# Patient Record
Sex: Female | Born: 2000 | Hispanic: No | Marital: Single | Smoking: Never smoker
Health system: Southern US, Community
[De-identification: ages and names within clinical notes are randomized; demographics above are authoritative.]

## PROBLEM LIST (undated history)

## (undated) DIAGNOSIS — D352 Benign neoplasm of pituitary gland: Secondary | ICD-10-CM

## (undated) DIAGNOSIS — F32A Depression, unspecified: Secondary | ICD-10-CM

## (undated) DIAGNOSIS — F419 Anxiety disorder, unspecified: Secondary | ICD-10-CM

## (undated) DIAGNOSIS — R51 Headache: Secondary | ICD-10-CM

## (undated) DIAGNOSIS — R519 Headache, unspecified: Secondary | ICD-10-CM

## (undated) DIAGNOSIS — R000 Tachycardia, unspecified: Secondary | ICD-10-CM

## (undated) DIAGNOSIS — K760 Fatty (change of) liver, not elsewhere classified: Secondary | ICD-10-CM

## (undated) DIAGNOSIS — J9801 Acute bronchospasm: Secondary | ICD-10-CM

## (undated) DIAGNOSIS — I471 Supraventricular tachycardia, unspecified: Secondary | ICD-10-CM

## (undated) HISTORY — DX: Headache, unspecified: R51.9

## (undated) HISTORY — DX: Headache: R51

## (undated) HISTORY — DX: Benign neoplasm of pituitary gland: D35.2

## (undated) HISTORY — PX: ELBOW SURGERY: SHX618

## (undated) HISTORY — DX: Acute bronchospasm: J98.01

## (undated) HISTORY — DX: Depression, unspecified: F32.A

## (undated) HISTORY — DX: Anxiety disorder, unspecified: F41.9

## (undated) HISTORY — PX: ABLATION: SHX5711

## (undated) HISTORY — DX: Fatty (change of) liver, not elsewhere classified: K76.0

---
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
## 2021-01-14 DIAGNOSIS — Z0000 Encounter for general adult medical examination without abnormal findings: Secondary | ICD-10-CM | POA: Diagnosis not present

## 2021-01-14 DIAGNOSIS — M792 Neuralgia and neuritis, unspecified: Secondary | ICD-10-CM | POA: Diagnosis not present

## 2021-01-14 DIAGNOSIS — G5601 Carpal tunnel syndrome, right upper limb: Secondary | ICD-10-CM | POA: Diagnosis not present

## 2021-07-09 DIAGNOSIS — Z6830 Body mass index (BMI) 30.0-30.9, adult: Secondary | ICD-10-CM | POA: Diagnosis not present

## 2021-07-09 DIAGNOSIS — N946 Dysmenorrhea, unspecified: Secondary | ICD-10-CM | POA: Diagnosis not present

## 2021-07-09 DIAGNOSIS — Z113 Encounter for screening for infections with a predominantly sexual mode of transmission: Secondary | ICD-10-CM | POA: Diagnosis not present

## 2021-07-09 DIAGNOSIS — N760 Acute vaginitis: Secondary | ICD-10-CM | POA: Diagnosis not present

## 2021-07-09 DIAGNOSIS — Z01419 Encounter for gynecological examination (general) (routine) without abnormal findings: Secondary | ICD-10-CM | POA: Diagnosis not present

## 2021-11-19 ENCOUNTER — Ambulatory Visit: Payer: 59 | Admitting: Gastroenterology

## 2021-11-19 ENCOUNTER — Encounter: Payer: Self-pay | Admitting: Nurse Practitioner

## 2021-11-23 ENCOUNTER — Encounter: Payer: Self-pay | Admitting: Nurse Practitioner

## 2021-11-23 ENCOUNTER — Ambulatory Visit (INDEPENDENT_AMBULATORY_CARE_PROVIDER_SITE_OTHER): Payer: BC Managed Care – PPO | Admitting: Nurse Practitioner

## 2021-11-23 ENCOUNTER — Other Ambulatory Visit (INDEPENDENT_AMBULATORY_CARE_PROVIDER_SITE_OTHER): Payer: BC Managed Care – PPO

## 2021-11-23 VITALS — BP 110/70 | HR 73 | Ht 62.0 in | Wt 158.0 lb

## 2021-11-23 DIAGNOSIS — R197 Diarrhea, unspecified: Secondary | ICD-10-CM | POA: Diagnosis not present

## 2021-11-23 DIAGNOSIS — R110 Nausea: Secondary | ICD-10-CM

## 2021-11-23 LAB — COMPREHENSIVE METABOLIC PANEL
ALT: 8 U/L (ref 0–35)
AST: 14 U/L (ref 0–37)
Albumin: 4.6 g/dL (ref 3.5–5.2)
Alkaline Phosphatase: 85 U/L (ref 39–117)
BUN: 7 mg/dL (ref 6–23)
CO2: 28 mEq/L (ref 19–32)
Calcium: 9.3 mg/dL (ref 8.4–10.5)
Chloride: 102 mEq/L (ref 96–112)
Creatinine, Ser: 0.7 mg/dL (ref 0.40–1.20)
GFR: 124.25 mL/min (ref >60.00)
Glucose, Bld: 89 mg/dL (ref 70–99)
Potassium: 3.7 mEq/L (ref 3.5–5.1)
Sodium: 137 mEq/L (ref 135–145)
Total Bilirubin: 0.6 mg/dL (ref 0.2–1.2)
Total Protein: 7.5 g/dL (ref 6.0–8.3)

## 2021-11-23 LAB — CBC WITH DIFFERENTIAL/PLATELET
Basophils Absolute: 0.1 10*3/uL (ref 0.0–0.1)
Basophils Relative: 0.5 % (ref 0.0–3.0)
Eosinophils Absolute: 0.1 10*3/uL (ref 0.0–0.7)
Eosinophils Relative: 0.9 % (ref 0.0–5.0)
HCT: 40.4 % (ref 36.0–46.0)
Hemoglobin: 13.7 g/dL (ref 12.0–15.0)
Lymphocytes Relative: 33.2 % (ref 12.0–46.0)
Lymphs Abs: 3.2 10*3/uL (ref 0.7–4.0)
MCHC: 33.9 g/dL (ref 30.0–36.0)
MCV: 86.6 fl (ref 78.0–100.0)
Monocytes Absolute: 0.6 10*3/uL (ref 0.1–1.0)
Monocytes Relative: 6.2 % (ref 3.0–12.0)
Neutro Abs: 5.8 10*3/uL (ref 1.4–7.7)
Neutrophils Relative %: 59.2 % (ref 43.0–77.0)
Platelets: 411 10*3/uL — ABNORMAL HIGH (ref 150.0–400.0)
RBC: 4.67 Mil/uL (ref 3.87–5.11)
RDW: 13.2 % (ref 11.5–14.6)
WBC: 9.7 10*3/uL (ref 4.5–10.5)

## 2021-11-23 LAB — C-REACTIVE PROTEIN: CRP: <1 mg/dL (ref 0.5–20.0)

## 2021-11-23 MED ORDER — FAMOTIDINE 20 MG PO TABS: 20.0000 mg | ORAL_TABLET | Freq: Every day | ORAL | 1 refills | Status: DC

## 2021-11-23 MED ORDER — ONDANSETRON HCL 4 MG PO TABS: 4.0000 mg | ORAL_TABLET | Freq: Four times a day (QID) | ORAL | 1 refills | Status: DC | PRN

## 2021-11-23 NOTE — Progress Notes (Signed)
11/23/2021 Maria [name] [id number] 2000-03-13   CHIEF COMPLAINT: Nausea and diarrhea  HISTORY OF PRESENT ILLNESS:  Maria [name] is a 21 year old female with a past medical history of SVT s/p ablation around agese 16 and 18. Past right elbow surgery.  She presents to our office today self referred for further evaluation regarding nausea and diarrhea which started 1 year ago and the symptoms have progressively worsened over the past 3 months.  She typically awakens between 12 AM and 5 AM with significant nausea without vomiting which is rather distressing to her to the point when she cries.  She then has 3 episodes of nonbloody diarrhea with lower abdominal cramping.  No severe abdominal pain.  The episodes of nausea and diarrhea occur 5 to 6 days weekly.  She has a normal formed brown bowel movement 1 day weekly.  No specific food triggers.  She last took an oral antibiotic  (Metronidazole) for bacterial vaginosis 5 months ago.  She takes Advil 2 tabs monthly for headaches or back pain associated with menstrual cycle.  No fevers.  No weight loss.  She denies having any dysphagia or GERD symptoms.  She was overweight in middle school and she stopped eating for period of time in order to lose weight.  She had significant depression and nausea in 2021 while she was in [place] At that time, her nausea was so bothersome she self-induced vomiting for symptom relief (not in attempt to lose weight).  She smoked marijuana in the past but not recently.  No known family history of IBD.  Past Medical History:  Diagnosis Date   Headache    SVT (supraventricular tachycardia)    Tachycardia    Past Surgical History:  Procedure Laterality Date   ABLATION     ELBOW SURGERY Right    Social History: She is single.  She smoked marijuana. Rare alcohol intake.   Family History: Paternal grandmother has diabetes. Maternal  uncle with colon cancer diagnosed maybe in his 54's. Mother has Lupus.    Allergies  Allergen Reactions   Banana Nausea And Vomiting      Outpatient Encounter Medications as of 11/23/2021  Medication Sig   albuterol (VENTOLIN HFA) 108 (90 Base) MCG/ACT inhaler    ibuprofen (ADVIL,MOTRIN) 200 MG tablet Take 200 mg by mouth every 6 (six) hours as needed for headache.   [DISCONTINUED] amitriptyline (ELAVIL) 25 MG tablet Take 1.5 tablets (37.5 mg total) by mouth at bedtime. (Start with one tablet every night for the first week) (Patient not taking: Reported on 08/14/2017)   [DISCONTINUED] dicyclomine (BENTYL) 20 MG tablet Take 1 tablet (20 mg total) by mouth 2 (two) times daily.   [DISCONTINUED] ibuprofen (CHILDRENS MOTRIN) 100 MG/5ML suspension Take 20 mLs (400 mg total) by mouth every 6 (six) hours as needed for pain. (Patient not taking: Reported on 05/26/2019)   [DISCONTINUED] ondansetron (ZOFRAN ODT) 4 MG disintegrating tablet Take 1 tablet (4 mg total) by mouth every 8 (eight) hours as needed for nausea or vomiting.   No facility-administered encounter medications on file as of 11/23/2021.   REVIEW OF SYSTEMS:  Gen: Denies fever, sweats or chills. No weight loss.  CV: Denies chest pain, palpitations or edema. Resp: Denies cough, shortness of breath of hemoptysis.  GI: See HPI.   GU : Denies urinary burning, blood in urine, increased urinary frequency or incontinence. MS: Denies joint pain, muscles aches or weakness. Derm: Denies rash, itchiness, skin lesions or unhealing ulcers. Psych: +  History of depression. Heme: Denies bruising, easy bleeding. Neuro:  Denies headaches, dizziness or paresthesias. Endo:  Denies any problems with DM, thyroid or adrenal function.  PHYSICAL EXAM: BP 110/70   Pulse 73   Ht 5\' 2"  (1.575 m)   Wt 158 lb (71.7 kg)   BMI 28.90 kg/m  General: 21 year old female in no acute distress. Head: Normocephalic and atraumatic. Eyes:  Sclerae non-icteric, conjunctive pink. Ears: Normal auditory acuity. Mouth: Dentition intact.  No ulcers or lesions.  Neck: Supple, no lymphadenopathy or thyromegaly.  Lungs: Clear bilaterally to auscultation without wheezes, crackles or rhonchi. Heart: Regular rate and rhythm. No murmur, rub or gallop appreciated.  Abdomen: Soft, nontender, non distended. No masses. No hepatosplenomegaly. Normoactive bowel sounds x 4 quadrants.  Rectal: Deferred. Musculoskeletal: Symmetrical with no gross deformities. Skin: Warm and dry. No rash or lesions on visible extremities. Extremities: No edema. Neurological: Alert oriented x 4, no focal deficits.  Psychological:  Alert and cooperative. Normal mood and affect.  ASSESSMENT AND PLAN:  41) 21 year old female with nausea -H. pylori stool antigen -Famotidine 20 mg 1 p.o. daily for possible gastritis, patient to start after H. pylori stool specimen collected -Ondansetron ODT 4 mg 1 tab dissolve on tongue every 68 hours as needed, patient to take at bedtime or earliest onset of nausea -Abdominal sonogram to evaluate the gallbladder if H. pylori stool antigen negative -Follow-up in 6 weeks, consider EGD if symptoms persist  2) Diarrhea -GI pathogen panel -CBC, CMP, CRP, TTG and IgA -Further recommendations to be determined after the above lab results reviewed -Consider diagnostic colonoscopy if diarrhea persists/worsens  Patient to contact office if symptoms worsen prior to follow-up appointment    CC:  26, MD

## 2021-11-23 NOTE — Patient Instructions (Addendum)
Your provider has requested that you go to the basement level for lab work before leaving today. Press "B" on the elevator. The lab is located at the first door on the left as you exit the elevator.   We have sent the following medications to your pharmacy for you to pick up at your convenience: Zofran 4 mg Famotidine 20 mg- do not start until stool tests are done  WE have scheduled your follow up appointment with Colleen for 01/10/22 at 2:30 pm.  Due to recent changes in healthcare laws, you may see the results of your imaging and laboratory studies on MyChart before your provider has had a chance to review them.  We understand that in some cases there may be results that are confusing or concerning to you. Not all laboratory results come back in the same time frame and the provider may be waiting for multiple results in order to interpret others.  Please give [place] 48 hours in order for your provider to thoroughly review all the results before contacting the office for clarification of your results.    Thank you for trusting me with your gastrointestinal care!   Alcide Evener, CRNP

## 2021-11-24 LAB — IGA: Immunoglobulin A: 211 mg/dL (ref 47–310)

## 2021-11-24 LAB — TISSUE TRANSGLUTAMINASE ABS,IGG,IGA
(tTG) Ab, IgA: <1 U/mL
(tTG) Ab, IgG: <1 U/mL

## 2021-11-30 ENCOUNTER — Other Ambulatory Visit: Payer: BC Managed Care – PPO

## 2021-11-30 DIAGNOSIS — R197 Diarrhea, unspecified: Secondary | ICD-10-CM

## 2021-11-30 DIAGNOSIS — R110 Nausea: Secondary | ICD-10-CM

## 2021-12-02 LAB — H. PYLORI ANTIGEN, STOOL: H pylori Ag, Stl: NEGATIVE

## 2021-12-04 LAB — GI PROFILE, STOOL, PCR

## 2021-12-08 NOTE — Progress Notes (Signed)
Agree with assessment/plan.  Raj Taneisha Fuson, MD [facility] [phone]  

## 2022-01-10 ENCOUNTER — Ambulatory Visit (INDEPENDENT_AMBULATORY_CARE_PROVIDER_SITE_OTHER): Payer: BC Managed Care – PPO | Admitting: Nurse Practitioner

## 2022-01-10 ENCOUNTER — Encounter: Payer: Self-pay | Admitting: Nurse Practitioner

## 2022-01-10 VITALS — BP 98/60 | HR 94 | Ht 62.0 in | Wt 152.0 lb

## 2022-01-10 DIAGNOSIS — R112 Nausea with vomiting, unspecified: Secondary | ICD-10-CM

## 2022-01-10 MED ORDER — ONDANSETRON HCL 4 MG PO TABS: 4.0000 mg | ORAL_TABLET | Freq: Four times a day (QID) | ORAL | 1 refills | Status: DC | PRN

## 2022-01-10 NOTE — Patient Instructions (Signed)
You have been scheduled for an abdominal ultrasound at [facility] (1st floor of hospital) on 01/13/22 at 9:00 am. Please arrive 30 minutes prior to your appointment for registration. Make certain not to have anything to eat or drink 8 hours prior to your appointment. Should you need to reschedule your appointment, please contact radiology at [phone]. This test typically takes about 30 minutes to perform.   Avoid fatty & spicy foods.  Thank you for trusting me with your gastrointestinal care!   Carl Best, CRNP

## 2022-01-10 NOTE — Addendum Note (Signed)
Addended by: Noralyn Pick on: 01/10/2022 04:43 PM   Modules accepted: Orders

## 2022-01-10 NOTE — Progress Notes (Signed)
01/10/2022 Maria [name] [id number] Mar 16, 2000   Chief Complaint: Nausea, diarrhea   History of Present Illness: Maria [name] is a 22 year old female with a past medical history of SVT s/p ablation around age16 and 18. Past right elbow surgery.  I initially saw Maria [name] 11/23/2021 due to having nausea and diarrhea.  Celiac serology was normal.  H. pylori stool antigen was negative.   CRP < 1. CBC showed a WBC count of 9.7.  Hemoglobin 13.7 mildly elevated platelet count 411.  Normal LFTs.  GI pathogen panel was negative.  She was prescribed ondansetron 4mg  ODT every 6-8 hours as needed and Famotidine 20 mg daily.  She presents today for further follow-up.  She took Famotidine for 30 days then stopped it as she continued to have nausea.  Ondansetron was ineffective.  She stated taking a friend's nausea medication which worked better but she cannot recall the name of this medication.  She vomited at 3 AM on 01/07/2022, emesis consisted of partially digested pizza she ate for dinner.  No hematemesis.  No further vomiting since then.  She stated her diarrhea has significantly calm down.  She is passing nonbloody diarrhea x 2 episodes two days weekly and the other days of the week she has passed a normal formed brown stool.  No further abdominal pain.  No NSAID use.  No marijuana use.  She has lost 6 pounds over the past 2 months.  Labs 11/23/2021: CRP < 1. TTG IgA < 1. TTG IGG < 1. IgA 211. H. Pylori stool antigen negative.      Latest Ref Rng & Units 11/23/2021    3:44 PM 05/26/2019    8:34 AM 09/17/2016    6:45 PM  CBC  WBC 4.5 - 10.5 K/uL 9.7  9.6  18.1   Hemoglobin 12.0 - 15.0 g/dL 13.7  13.4  13.1   Hematocrit 36.0 - 46.0 % 40.4  40.9  39.1   Platelets 150.0 - 400.0 K/uL 411.0  394  485        Latest Ref Rng & Units 11/23/2021    3:44 PM 05/26/2019    8:34 AM 09/17/2016    6:45 PM  CMP  Glucose 70 - 99 mg/dL 89  91  131   BUN 6 - 23 mg/dL 7  10  8    Creatinine 0.40 -  1.20 mg/dL 0.70  0.81  0.88   Sodium 135 - 145 mEq/L 137  138  137   Potassium 3.5 - 5.1 mEq/L 3.7  3.5  3.3   Chloride 96 - 112 mEq/L 102  102  105   CO2 19 - 32 mEq/L 28  25  23    Calcium 8.4 - 10.5 mg/dL 9.3  8.9  9.5   Total Protein 6.0 - 8.3 g/dL 7.5  7.7    Total Bilirubin 0.2 - 1.2 mg/dL 0.6  0.8    Alkaline Phos 39 - 117 U/L 85  73    AST 0 - 37 U/L 14  36    ALT 0 - 35 U/L 8  31      Current Outpatient Medications on File Prior to Visit  Medication Sig Dispense Refill   albuterol (VENTOLIN HFA) 108 (90 Base) MCG/ACT inhaler      ibuprofen (ADVIL,MOTRIN) 200 MG tablet Take 200 mg by mouth every 6 (six) hours as needed for headache.     ondansetron (ZOFRAN) 4 MG tablet Take 1 tablet (4 mg total)  by mouth every 6 (six) hours as needed for nausea or vomiting. Dissolve under tongue every 6-8 hours as needed. 30 tablet 1   No current facility-administered medications on file prior to visit.   Allergies  Allergen Reactions   Banana Nausea And Vomiting   Current Medications, Allergies, Past Medical History, Past Surgical History, Family History and Social History were reviewed in [employer] record.  Review of Systems:   Constitutional: Negative for fever, sweats, chills or weight loss.  Respiratory: Negative for shortness of breath.   Cardiovascular: Negative for chest pain, palpitations and leg swelling.  Gastrointestinal: See HPI.  Musculoskeletal: Negative for back pain or muscle aches.  Neurological: Negative for dizziness, headaches or paresthesias.    Physical Exam: Ht 5\' 2"  (1.575 m)   Wt 152 lb (68.9 kg)   BMI 27.80 kg/m  Wt Readings from Last 3 Encounters:  01/10/22 152 lb (68.9 kg)  11/23/21 158 lb (71.7 kg)  08/14/17 142 lb 3.2 oz (64.5 kg) (81 %, Z= 0.89)*   * Growth percentiles are based on CDC (Girls, 2-20 Years) data.    General: 22 year old female in no acute distress. Head: Normocephalic and atraumatic. Eyes: No scleral  icterus. Conjunctiva pink . Ears: Normal auditory acuity. Mouth: Dentition intact. No ulcers or lesions.  Lungs: Clear throughout to auscultation. Heart: Regular rate and rhythm, no murmur. Abdomen: Soft, nontender and nondistended. No masses or hepatomegaly. Normal bowel sounds x 4 quadrants.  Rectal: Deferred.  Musculoskeletal: Symmetrical with no gross deformities. Extremities: No edema. Neurological: Alert oriented x 4. No focal deficits.  Psychological: Alert and cooperative. Normal mood and affect  Assessment and Recommendations:  53) 22 year old female with daily nausea, vomited at 3am on 01/07/2022. H. Pylori stool antigen negative.  -Abdominal sonogram to evaluate the gallbladder  -EGD benefits and risks discussed including risk with sedation, risk of bleeding, perforation and infection.  Schedule EGD after abdominal sonogram results reviewed. -Patient will contact me with name of preferred antiemetic -Patient to contact [name] if symptoms worsen -Avoid fatty/greasy foods  2) Diarrhea, improving.  GI pathogen panel negative.  CRP < 1.0.  -For diagnostic colonoscopy for now her symptoms have improved   Today's encounter was 25 minutes which included precharting, chart/result review, history/exam, face-to-face time used for counseling, formulating a treatment plan with follow-up and documentation.

## 2022-01-13 ENCOUNTER — Ambulatory Visit (HOSPITAL_COMMUNITY)
Admission: RE | Admit: 2022-01-13 | Discharge: 2022-01-13 | Disposition: A | Discharge status: Home / Self Care | Payer: BC Managed Care – PPO | Source: Ambulatory Visit | Attending: Nurse Practitioner | Admitting: Nurse Practitioner

## 2022-01-13 DIAGNOSIS — R112 Nausea with vomiting, unspecified: Secondary | ICD-10-CM | POA: Insufficient documentation

## 2022-01-13 DIAGNOSIS — Z0389 Encounter for observation for other suspected diseases and conditions ruled out: Secondary | ICD-10-CM | POA: Diagnosis not present

## 2022-01-23 NOTE — Progress Notes (Signed)
Agree with assessment/plan.  Raj Anniya Whiters, MD [facility] [phone]  

## 2022-07-21 DIAGNOSIS — F410 Panic disorder [episodic paroxysmal anxiety] without agoraphobia: Secondary | ICD-10-CM | POA: Diagnosis not present

## 2022-07-21 DIAGNOSIS — F32A Depression, unspecified: Secondary | ICD-10-CM | POA: Diagnosis not present

## 2022-07-21 DIAGNOSIS — R110 Nausea: Secondary | ICD-10-CM | POA: Diagnosis not present

## 2022-08-02 ENCOUNTER — Emergency Department (HOSPITAL_BASED_OUTPATIENT_CLINIC_OR_DEPARTMENT_OTHER)
Admission: EM | Admit: 2022-08-02 | Discharge: 2022-08-03 | Disposition: A | Discharge status: Other Acute Inpatient Hospital | Payer: BC Managed Care – PPO | Attending: Emergency Medicine | Admitting: Emergency Medicine

## 2022-08-02 ENCOUNTER — Other Ambulatory Visit: Payer: Self-pay

## 2022-08-02 ENCOUNTER — Encounter (HOSPITAL_BASED_OUTPATIENT_CLINIC_OR_DEPARTMENT_OTHER): Payer: Self-pay

## 2022-08-02 ENCOUNTER — Emergency Department (HOSPITAL_BASED_OUTPATIENT_CLINIC_OR_DEPARTMENT_OTHER): Payer: BC Managed Care – PPO

## 2022-08-02 DIAGNOSIS — F332 Major depressive disorder, recurrent severe without psychotic features: Secondary | ICD-10-CM | POA: Insufficient documentation

## 2022-08-02 DIAGNOSIS — R0789 Other chest pain: Secondary | ICD-10-CM | POA: Diagnosis not present

## 2022-08-02 DIAGNOSIS — R45851 Suicidal ideations: Secondary | ICD-10-CM | POA: Diagnosis not present

## 2022-08-02 DIAGNOSIS — R079 Chest pain, unspecified: Secondary | ICD-10-CM | POA: Insufficient documentation

## 2022-08-02 LAB — CBC WITH DIFFERENTIAL/PLATELET
Abs Immature Granulocytes: 0.02 10*3/uL (ref 0.00–0.07)
Basophils Absolute: 0 10*3/uL (ref 0.0–0.1)
Basophils Relative: 0 %
Eosinophils Absolute: 0 10*3/uL (ref 0.0–0.5)
Eosinophils Relative: 0 %
HCT: 39.5 % (ref 36.0–46.0)
Hemoglobin: 13.3 g/dL (ref 12.0–15.0)
Immature Granulocytes: 0 %
Lymphocytes Relative: 25 %
Lymphs Abs: 2.4 10*3/uL (ref 0.7–4.0)
MCH: 29.4 pg (ref 26.0–34.0)
MCHC: 33.7 g/dL (ref 30.0–36.0)
MCV: 87.4 fL (ref 80.0–100.0)
Monocytes Absolute: 0.5 10*3/uL (ref 0.1–1.0)
Monocytes Relative: 6 %
Neutro Abs: 6.7 10*3/uL (ref 1.7–7.7)
Neutrophils Relative %: 69 %
Platelets: 378 10*3/uL (ref 150–400)
RBC: 4.52 MIL/uL (ref 3.87–5.11)
RDW: 12.3 % (ref 11.5–15.5)
WBC: 9.7 10*3/uL (ref 4.0–10.5)
nRBC: 0 % (ref 0.0–0.2)

## 2022-08-02 LAB — COMPREHENSIVE METABOLIC PANEL
ALT: 9 U/L (ref 0–44)
AST: 13 U/L — ABNORMAL LOW (ref 15–41)
Albumin: 4.5 g/dL (ref 3.5–5.0)
Alkaline Phosphatase: 55 U/L (ref 38–126)
Anion gap: 12 (ref 5–15)
BUN: 10 mg/dL (ref 6–20)
CO2: 24 mmol/L (ref 22–32)
Calcium: 9.2 mg/dL (ref 8.9–10.3)
Chloride: 102 mmol/L (ref 98–111)
Creatinine, Ser: 0.7 mg/dL (ref 0.44–1.00)
GFR, Estimated: >60 mL/min (ref >60)
Glucose, Bld: 91 mg/dL (ref 70–99)
Potassium: 3.6 mmol/L (ref 3.5–5.1)
Sodium: 138 mmol/L (ref 135–145)
Total Bilirubin: 0.6 mg/dL (ref 0.3–1.2)
Total Protein: 7.3 g/dL (ref 6.5–8.1)

## 2022-08-02 LAB — ACETAMINOPHEN LEVEL: Acetaminophen (Tylenol), Serum: <10 ug/mL — ABNORMAL LOW (ref 10–30)

## 2022-08-02 LAB — RAPID URINE DRUG SCREEN, HOSP PERFORMED
Amphetamines: NOT DETECTED
Barbiturates: NOT DETECTED
Benzodiazepines: POSITIVE — AB
Cocaine: NOT DETECTED
Opiates: NOT DETECTED
Tetrahydrocannabinol: POSITIVE — AB

## 2022-08-02 LAB — SALICYLATE LEVEL: Salicylate Lvl: <7 mg/dL — ABNORMAL LOW (ref 7.0–30.0)

## 2022-08-02 LAB — PREGNANCY, URINE: Preg Test, Ur: NEGATIVE

## 2022-08-02 LAB — TROPONIN I (HIGH SENSITIVITY): Troponin I (High Sensitivity): <2 ng/L (ref <18)

## 2022-08-02 LAB — ETHANOL: Alcohol, Ethyl (B): <10 mg/dL (ref <10)

## 2022-08-02 MED ORDER — ONDANSETRON HCL 4 MG PO TABS
4.0000 mg | ORAL_TABLET | Freq: Three times a day (TID) | ORAL | Status: DC | PRN
  Filled 2022-08-02: qty 1

## 2022-08-02 MED ORDER — MELATONIN 3 MG PO TABS
3.0000 mg | ORAL_TABLET | Freq: Every day | ORAL | Status: DC
  Administered 2022-08-02: 3 mg via ORAL
  Filled 2022-08-02: qty 1

## 2022-08-02 MED ORDER — ESCITALOPRAM OXALATE 10 MG PO TABS
10.0000 mg | ORAL_TABLET | Freq: Every day | ORAL | Status: DC
  Administered 2022-08-02: 10 mg via ORAL
  Filled 2022-08-02: qty 1

## 2022-08-02 MED ORDER — ACETAMINOPHEN 325 MG PO TABS: 650.0000 mg | ORAL_TABLET | ORAL | Status: DC | PRN

## 2022-08-02 MED ORDER — ONDANSETRON HCL 4 MG/2ML IJ SOLN
4.0000 mg | Freq: Once | INTRAMUSCULAR | Status: AC
  Administered 2022-08-03: 4 mg via INTRAVENOUS
  Filled 2022-08-02: qty 2

## 2022-08-02 MED ORDER — ALBUTEROL SULFATE HFA 108 (90 BASE) MCG/ACT IN AERS: 2.0000 | INHALATION_SPRAY | RESPIRATORY_TRACT | Status: DC | PRN

## 2022-08-02 MED ORDER — ALUM & MAG HYDROXIDE-SIMETH 200-200-20 MG/5ML PO SUSP
30.0000 mL | Freq: Four times a day (QID) | ORAL | Status: DC | PRN
  Filled 2022-08-02: qty 30

## 2022-08-02 NOTE — ED Notes (Signed)
Sitter at bedside.

## 2022-08-02 NOTE — ED Notes (Signed)
RT Note: Patient has HX of using an Albuterol inhaler prn for bronchospasms. When asked, the patient stated she doesn't need or want the Albuterol inhaler at this time. Patient made aware that if she changes her mind there is an inhaler available for her.

## 2022-08-02 NOTE — BH Assessment (Signed)
Attempted to contact pt's RN Conner via phone to advise of recommendation without success.

## 2022-08-02 NOTE — Progress Notes (Signed)
Pt was accepted to [facility] TODAY     08/02/2022; Bed Assignment [id number] [place], [place], [place] [address]; PENDING signed voluntary consent faxed to [facility] [phone]  Pt meets inpatient criteria per Julaine Fusi  Attending Physician will be Dr. Shellee Milo  Report can be called to: - [phone]  Pt can arrive after: [facility] Charge RN will coordinate with care team.  Care Team notified: Day [facility] AC Danika Riley,RN, Night [facility] Kim Brooks,RN, Beryle Flock, Counselor, Arnette Felts, RN, Eugenio Hoes, Lavonna Monarch, V. Chinwendu Onuoha,NP, Rogelia Boga [address] [place], LCSWA 08/02/2022 @ 9:55 PM

## 2022-08-02 NOTE — ED Triage Notes (Signed)
Pt POV from home c/o chest pain that began approx 1 hr PTA. Pt advised she is going through a break up and was recently dx w/ anxiety, new med for same x 1 week. Pt states hx of SVT and heart ablations x 2. Pt visibly upset/crying. CP in center of chest, tightness, denies SOB, N/V/D.

## 2022-08-02 NOTE — ED Provider Notes (Signed)
[facility] EMERGENCY DEPARTMENT AT [facility] Provider Note   CSN: [id number] Arrival date & time: 08/02/22  1255     History  Chief Complaint  Patient presents with   Chest Pain   Suicidal    Maria [name] is a 22 y.o. female.  22 year old female presents with complaint of CP/tightness. Patient reports breaking up with her girlfriend of 3-4 years 1 week ago. "Tried to end it," by cutting left wrist 1 week ago. Patient went to [place] to stay with her aunt for the past week, states she felt better and enjoyed her time there but her mother will not allow her to stay there long term and she returned home. Patient went back to the apartment she shared with her ex to get some belongings. They "had words" and the ex left, she then developed a tightness in her chest and was very anxious. Has had prior heart ablation x 2, feels like her symptoms are more related to anxiety from her confrontation with ex today. Patient did go to a provider just over a week ago and was started on medication for anxiety- Lexapro, and another medication she is unsure of the name, reports compliance with her medications. Denies HI, denies SI or plan at this time, no drug or alcohol use. States she has not been eating because of feeling nauseous and lack of appetite, reports this to be a chronic/reoccuring problem. Has forced vomiting in the past to try and help with her symptoms, is not currently doing this.        Home Medications Prior to Admission medications   Medication Sig Start Date End Date Taking? Authorizing Provider  albuterol (VENTOLIN HFA) 108 (90 Base) MCG/ACT inhaler  06/07/17  Yes [provider]  ALPRAZolam (XANAX) 0.25 MG tablet Take 0.25 mg by mouth at bedtime as needed for anxiety.   Yes [provider]  escitalopram (LEXAPRO) 10 MG tablet Take 10 mg by mouth daily.   Yes [provider]  ibuprofen (ADVIL,MOTRIN) 200 MG tablet Take 200 mg by mouth every 6 (six)  hours as needed for headache.   Yes [provider]  ondansetron (ZOFRAN) 4 MG tablet Take 1 tablet (4 mg total) by mouth every 6 (six) hours as needed for nausea or vomiting. Dissolve under tongue every 6-8 hours as needed. 01/10/22  Yes Arnaldo Natal, NP      Allergies    Banana    Review of Systems   Review of Systems Negative except as per HPI Physical Exam Updated Vital Signs BP 103/72 (BP Location: Left Arm)   Pulse 78   Temp 98.5 F (36.9 C) (Oral)   Resp 16   Ht 5\' 2"  (1.575 m)   Wt 66.7 kg   LMP 07/30/2022 (Exact Date)   SpO2 100%   BMI 26.89 kg/m  Physical Exam Vitals and nursing note reviewed.  Constitutional:      General: She is not in acute distress.    Appearance: She is well-developed. She is not diaphoretic.  HENT:     Head: Normocephalic and atraumatic.  Cardiovascular:     Rate and Rhythm: Normal rate and regular rhythm.     Heart sounds: Normal heart sounds.  Pulmonary:     Effort: Pulmonary effort is normal.     Breath sounds: Normal breath sounds.  Chest:     Chest wall: No tenderness.  Abdominal:     Palpations: Abdomen is soft.     Tenderness:  There is no abdominal tenderness.  Musculoskeletal:     Right lower leg: No tenderness. No edema.     Left lower leg: No tenderness. No edema.  Skin:    General: Skin is warm and dry.     Findings: No erythema or rash.  Neurological:     Mental Status: She is alert and oriented to person, place, and time.  Psychiatric:        Behavior: Behavior normal.     ED Results / Procedures / Treatments   Labs (all labs ordered are listed, but only abnormal results are displayed) Labs Reviewed  COMPREHENSIVE METABOLIC PANEL - Abnormal; Notable for the following components:      Result Value   AST 13 (*)    All other components within normal limits  RAPID URINE DRUG SCREEN, HOSP PERFORMED - Abnormal; Notable for the following components:   Benzodiazepines POSITIVE (*)     Tetrahydrocannabinol POSITIVE (*)    All other components within normal limits  SALICYLATE LEVEL - Abnormal; Notable for the following components:   Salicylate Lvl <7.0 (*)    All other components within normal limits  ACETAMINOPHEN LEVEL - Abnormal; Notable for the following components:   Acetaminophen (Tylenol), Serum <10 (*)    All other components within normal limits  ETHANOL  CBC WITH DIFFERENTIAL/PLATELET  PREGNANCY, URINE  TROPONIN I (HIGH SENSITIVITY)    EKG EKG Interpretation Date/Time:  Tuesday August 02 2022 13:11:22 EDT Ventricular Rate:  95 PR Interval:  131 QRS Duration:  86 QT Interval:  332 QTC Calculation: 418 R Axis:   80  Text Interpretation: Sinus rhythm Nonspecific T abnormalities, anterior leads - seen on Dec 01 2016 ecg Confirmed by Alvester Chou [phone]) on 08/02/2022 2:27:26 PM  Radiology DG Chest Port 1 View  Result Date: 08/02/2022 CLINICAL DATA:  [id number] Chest pain [id number] EXAM: PORTABLE CHEST - 1 VIEW COMPARISON:  12/01/2016 FINDINGS: Lungs are clear.  No pneumothorax. Heart size and mediastinal contours are within normal limits. No effusion. Visualized bones unremarkable. IMPRESSION: No acute cardiopulmonary disease. Electronically Signed   By: Corlis Leak M.D.   On: 08/02/2022 14:21    Procedures Procedures    Medications Ordered in ED Medications  alum & mag hydroxide-simeth (MAALOX/MYLANTA) 200-200-20 MG/5ML suspension 30 mL (has no administration in time range)  ondansetron (ZOFRAN) tablet 4 mg (has no administration in time range)  acetaminophen (TYLENOL) tablet 650 mg (has no administration in time range)  melatonin tablet 3 mg (has no administration in time range)  albuterol (VENTOLIN HFA) 108 (90 Base) MCG/ACT inhaler 2 puff (has no administration in time range)  escitalopram (LEXAPRO) tablet 10 mg (has no administration in time range)    ED Course/ Medical Decision Making/ A&P Clinical Course as of 08/02/22 1804  Tue Aug 02, 2022   1726 Medically cleared for [facility] eval and dispo. [LM]    Clinical Course User Index [LM] Jeannie Fend, PA-C                                 Medical Decision Making Amount and/or Complexity of Data Reviewed Labs: ordered. Radiology: ordered.  Risk OTC drugs. Prescription drug management.   This patient presents to the ED for concern of chest pain, anxiety, depression, this involves an extensive number of treatment options, and is a complaint that carries with it a high risk of complications and morbidity.  The differential diagnosis includes  but not limited to anxiety attack, arrhythmia, depression, SI   Co morbidities that complicate the patient evaluation  SVT- treated with ablation x 2. Anxiety, depression   Additional history obtained:  External records from outside source obtained and reviewed including prior labs on file for comparison    Lab Tests:  I Ordered, and personally interpreted labs.  The pertinent results include:  UDS positive for benzos (has rx) and marijuana. CBC WNL. Hcg negative. CMP without significant findings. ASA and APAP levels negative. Troponin <2. Etoh negative.    Imaging Studies ordered:  I ordered imaging studies including CXR  I independently visualized and interpreted imaging which showed unremarkable  I agree with the radiologist interpretation   Cardiac Monitoring: / EKG:  The patient was maintained on a cardiac monitor.  I personally viewed and interpreted the cardiac monitored which showed an underlying rhythm of: sinus rhythm rate 95   Consultations Obtained:  I requested consultation with the [facility] teah,  and discussed lab and imaging findings as well as pertinent plan - they recommend: admit to inpatient   Problem List / ED Course / Critical interventions / Medication management  22 year old female presents with complaint of CP with depression with question of SI although denies active plan. Patient was medically cleared,  seen by [facility] and recommended for admission. IVC papers will be on file if needed.  Home medications have been ordered.  I have reviewed the patients home medicines and have made adjustments as needed   Social Determinants of Health:  Has family locally    Test / Admission - Considered:  Admit to [facility]         Final Clinical Impression(s) / ED Diagnoses Final diagnoses:  Chest pain, unspecified type  Suicidal ideation    Rx / DC Orders ED Discharge Orders     None         Jeannie Fend, PA-C 08/02/22 1804    Terald Sleeper, MD 08/03/22 [phone]
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
## 2022-08-03 ENCOUNTER — Inpatient Hospital Stay
Admission: AD | Admit: 2022-08-03 | Discharge: 2022-08-06 | DRG: 885 | Disposition: A | Discharge status: Home / Self Care | Payer: 59 | Source: Intra-hospital | Attending: Psychiatry | Admitting: Psychiatry

## 2022-08-03 ENCOUNTER — Encounter: Payer: Self-pay | Admitting: Psychiatry

## 2022-08-03 DIAGNOSIS — Z9152 Personal history of nonsuicidal self-harm: Secondary | ICD-10-CM | POA: Diagnosis not present

## 2022-08-03 DIAGNOSIS — G4700 Insomnia, unspecified: Secondary | ICD-10-CM | POA: Diagnosis present

## 2022-08-03 DIAGNOSIS — F419 Anxiety disorder, unspecified: Secondary | ICD-10-CM | POA: Diagnosis present

## 2022-08-03 DIAGNOSIS — F32A Depression, unspecified: Secondary | ICD-10-CM | POA: Diagnosis present

## 2022-08-03 DIAGNOSIS — R45851 Suicidal ideations: Secondary | ICD-10-CM | POA: Diagnosis not present

## 2022-08-03 DIAGNOSIS — F332 Major depressive disorder, recurrent severe without psychotic features: Secondary | ICD-10-CM | POA: Diagnosis not present

## 2022-08-03 DIAGNOSIS — R079 Chest pain, unspecified: Secondary | ICD-10-CM | POA: Diagnosis not present

## 2022-08-03 MED ORDER — ALBUTEROL SULFATE HFA 108 (90 BASE) MCG/ACT IN AERS: 1.0000 | INHALATION_SPRAY | Freq: Four times a day (QID) | RESPIRATORY_TRACT | Status: DC | PRN

## 2022-08-03 MED ORDER — ESCITALOPRAM OXALATE 10 MG PO TABS
10.0000 mg | ORAL_TABLET | Freq: Every day | ORAL | Status: DC
  Administered 2022-08-03: 10 mg via ORAL
  Filled 2022-08-03: qty 1

## 2022-08-03 MED ORDER — DIPHENHYDRAMINE HCL 25 MG PO CAPS: 25.0000 mg | ORAL_CAPSULE | Freq: Three times a day (TID) | ORAL | Status: DC | PRN

## 2022-08-03 MED ORDER — OLANZAPINE 10 MG PO TABS: 10.0000 mg | ORAL_TABLET | Freq: Four times a day (QID) | ORAL | Status: DC | PRN

## 2022-08-03 MED ORDER — ACETAMINOPHEN 325 MG PO TABS: 650.0000 mg | ORAL_TABLET | Freq: Four times a day (QID) | ORAL | Status: DC | PRN

## 2022-08-03 MED ORDER — FLUOXETINE HCL 20 MG PO CAPS
20.0000 mg | ORAL_CAPSULE | Freq: Every day | ORAL | Status: DC
  Administered 2022-08-03 – 2022-08-06 (×4): 20 mg via ORAL
  Filled 2022-08-03 (×5): qty 1

## 2022-08-03 MED ORDER — LORAZEPAM 0.5 MG PO TABS: 0.5000 mg | ORAL_TABLET | ORAL | Status: DC | PRN

## 2022-08-03 MED ORDER — LORAZEPAM 2 MG/ML IJ SOLN: 2.0000 mg | Freq: Three times a day (TID) | INTRAMUSCULAR | Status: DC | PRN

## 2022-08-03 MED ORDER — RISPERIDONE 1 MG PO TABS
0.5000 mg | ORAL_TABLET | ORAL | Status: DC
  Administered 2022-08-03 – 2022-08-06 (×6): 0.5 mg via ORAL
  Filled 2022-08-03 (×7): qty 1

## 2022-08-03 MED ORDER — ALUM & MAG HYDROXIDE-SIMETH 200-200-20 MG/5ML PO SUSP: 30.0000 mL | ORAL | Status: DC | PRN

## 2022-08-03 MED ORDER — TRAZODONE HCL 50 MG PO TABS: 50.0000 mg | ORAL_TABLET | Freq: Every evening | ORAL | Status: DC | PRN

## 2022-08-03 MED ORDER — LORAZEPAM 2 MG PO TABS: 2.0000 mg | ORAL_TABLET | Freq: Three times a day (TID) | ORAL | Status: DC | PRN

## 2022-08-03 MED ORDER — MAGNESIUM HYDROXIDE 400 MG/5ML PO SUSP: 30.0000 mL | Freq: Every day | ORAL | Status: DC | PRN

## 2022-08-03 NOTE — BHH Counselor (Signed)
CSW provided patient with a list of providers with LGBTQIA+ experience at patient's request.  Penni Homans, MSW, LCSW 08/03/2022 2:59 PM

## 2022-08-03 NOTE — BH IP Treatment Plan (Signed)
Interdisciplinary Treatment and Diagnostic Plan Update  08/03/2022 Time of Session: 9:20AM Maria [name] MRN: [id number]  Principal Diagnosis: Anxiety and depression  Secondary Diagnoses: Principal Problem:   Anxiety and depression   Current Medications:  Current Facility-Administered Medications  Medication Dose Route Frequency Provider Last Rate Last Admin   acetaminophen (TYLENOL) tablet 650 mg  650 mg Oral Q6H PRN Onuoha, Chinwendu V, NP       albuterol (VENTOLIN HFA) 108 (90 Base) MCG/ACT inhaler 1-2 puff  1-2 puff Inhalation Q6H PRN Onuoha, Chinwendu V, NP       alum & mag hydroxide-simeth (MAALOX/MYLANTA) 200-200-20 MG/5ML suspension 30 mL  30 mL Oral Q4H PRN Onuoha, Chinwendu V, NP       diphenhydrAMINE (BENADRYL) capsule 25 mg  25 mg Oral Q8H PRN Onuoha, Chinwendu V, NP       escitalopram (LEXAPRO) tablet 10 mg  10 mg Oral Daily Onuoha, Chinwendu V, NP   10 mg at 08/03/22 0827   LORazepam (ATIVAN) tablet 2 mg  2 mg Oral TID PRN Onuoha, Chinwendu V, NP       Or   LORazepam (ATIVAN) injection 2 mg  2 mg Intramuscular TID PRN Onuoha, Chinwendu V, NP       magnesium hydroxide (MILK OF MAGNESIA) suspension 30 mL  30 mL Oral Daily PRN Onuoha, Chinwendu V, NP       PTA Medications: Medications Prior to Admission  Medication Sig Dispense Refill Last Dose   albuterol (VENTOLIN HFA) 108 (90 Base) MCG/ACT inhaler       ALPRAZolam (XANAX) 0.25 MG tablet Take 0.25 mg by mouth at bedtime as needed for anxiety.      escitalopram (LEXAPRO) 10 MG tablet Take 10 mg by mouth daily.      ibuprofen (ADVIL,MOTRIN) 200 MG tablet Take 200 mg by mouth every 6 (six) hours as needed for headache.      ondansetron (ZOFRAN) 4 MG tablet Take 1 tablet (4 mg total) by mouth every 6 (six) hours as needed for nausea or vomiting. Dissolve under tongue every 6-8 hours as needed. 30 tablet 1     Patient Stressors: Marital or family conflict   Medication change or noncompliance    Patient Strengths:  Ability for insight  Motivation for treatment/growth   Treatment Modalities: Medication Management, Group therapy, Case management,  1 to 1 session with clinician, Psychoeducation, Recreational therapy.   Physician Treatment Plan for Primary Diagnosis: Anxiety and depression Long Term Goal(s):     Short Term Goals:    Medication Management: Evaluate patient's response, side effects, and tolerance of medication regimen.  Therapeutic Interventions: 1 to 1 sessions, Unit Group sessions and Medication administration.  Evaluation of Outcomes: Not Met  Physician Treatment Plan for Secondary Diagnosis: Principal Problem:   Anxiety and depression  Long Term Goal(s):     Short Term Goals:       Medication Management: Evaluate patient's response, side effects, and tolerance of medication regimen.  Therapeutic Interventions: 1 to 1 sessions, Unit Group sessions and Medication administration.  Evaluation of Outcomes: Not Met   RN Treatment Plan for Primary Diagnosis: Anxiety and depression Long Term Goal(s): Knowledge of disease and therapeutic regimen to maintain health will improve  Short Term Goals: Ability to verbalize frustration and anger appropriately will improve, Ability to demonstrate self-control, Ability to participate in decision making will improve, Ability to verbalize feelings will improve, Ability to disclose and discuss suicidal ideas, Ability to identify and develop effective coping behaviors will improve,  and Compliance with prescribed medications will improve  Medication Management: RN will administer medications as ordered by provider, will assess and evaluate patient's response and provide education to patient for prescribed medication. RN will report any adverse and/or side effects to prescribing provider.  Therapeutic Interventions: 1 on 1 counseling sessions, Psychoeducation, Medication administration, Evaluate responses to treatment, Monitor vital signs and CBGs  as ordered, Perform/monitor CIWA, COWS, AIMS and Fall Risk screenings as ordered, Perform wound care treatments as ordered.  Evaluation of Outcomes: Not Met   LCSW Treatment Plan for Primary Diagnosis: Anxiety and depression Long Term Goal(s): Safe transition to appropriate next level of care at discharge, Engage patient in therapeutic group addressing interpersonal concerns.  Short Term Goals: Engage patient in aftercare planning with referrals and resources, Increase social support, Increase ability to appropriately verbalize feelings, Increase emotional regulation, Facilitate acceptance of mental health diagnosis and concerns, and Increase skills for wellness and recovery  Therapeutic Interventions: Assess for all discharge needs, 1 to 1 time with Social worker, Explore available resources and support systems, Assess for adequacy in community support network, Educate family and significant other(s) on suicide prevention, Complete Psychosocial Assessment, Interpersonal group therapy.  Evaluation of Outcomes: Not Met   Progress in Treatment: Attending groups: No. Participating in groups: No. Taking medication as prescribed: Yes. Toleration medication: Yes. Family/Significant other contact made: No, will contact:  once permission is given Patient understands diagnosis: Yes. Discussing patient identified problems/goals with staff: Yes. Medical problems stabilized or resolved: Yes. Denies suicidal/homicidal ideation: Yes. Issues/concerns per patient self-inventory: No. Other: none  New problem(s) identified: No, Describe:  none  New Short Term/Long Term Goal(s): medication management for mood stabilization; elimination of SI thoughts; development of comprehensive mental wellness/sobriety plan.   Patient Goals:  "I just want to stop hurting.  I'm tired of feeling sick.  I am mad that I'm 22 and never learned how to express my emotions."  Discharge Plan or Barriers: CSW to assist in the  development of appropriate discharge plans.   Reason for Continuation of Hospitalization: Anxiety Depression Medication stabilization Suicidal ideation  Estimated Length of Stay:  1-7 days  Last 3 [place] Suicide Severity Risk Score: Flowsheet Row Admission (Current) from 08/03/2022 in [facility] INPATIENT BE[facility] MEDICINE ED from 08/02/2022 in [facility] Emergency Department at [facility]  C-SSRS RISK CATEGORY No Risk High Risk       Last PHQ 2/9 Scores:     No data to display          Scribe for Treatment Team: Harden Mo, LCSW 08/03/2022 10:05 AM

## 2022-08-03 NOTE — Tx Team (Signed)
Initial Treatment Plan 08/03/2022 1:12 AM Maria [name] ZOX:[id number]    PATIENT STRESSORS: Marital or family conflict   Medication change or noncompliance     PATIENT STRENGTHS: Ability for insight  Motivation for treatment/growth    PATIENT IDENTIFIED PROBLEMS: Depression  Suicidal Ideation  Anxiety                 DISCHARGE CRITERIA:  Motivation to continue treatment in a less acute level of care Verbal commitment to aftercare and medication compliance  PRELIMINARY DISCHARGE PLAN: Outpatient therapy Return to previous living arrangement  PATIENT/FAMILY INVOLVEMENT: This treatment plan has been presented to and reviewed with the patient, Maria [name].  The patient has been given the opportunity to ask questions and make suggestions.  Elmyra Ricks, RN 08/03/2022, 1:12 AM

## 2022-08-03 NOTE — Plan of Care (Signed)
Patient appears sad and tearful this morning.Patient states " lot of stuff happening in my family. I just want to feel better."Patient verbalized poor appetite.Appears brighter this afternoon.Patient denies SI,HI and AVH. Visible in the milieu. Appropriate with staff and peers. Support and encouragement given.

## 2022-08-03 NOTE — H&P (Addendum)
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  She broke up with her girlfriend about 2 weeks ago and moved and with her parents with whom she has problems with at times.  Because of this she ran off to [place] to be with other family but her anxiety and depression has worsened and she started cutting on herself.  She has a history of cutting on herself since age 47.  She has a history of supraventricular tachycardia with 2 ablations.  She has seen a therapist 3 times but did not connect but is willing to see a psychiatrist and a different therapist.  She is able to contract for safety in the hospital.  She endorses anhedonia, difficulty sleeping or sleeping too much, anxiety, depressed mood, mood swings, uncontrollable tearfulness, and self-harm.  She has never seen a psychiatrist and has never been psychiatrically hospitalized.  She is willing to try new medications.  UDS is positive for marijuana.  She just started on Xanax and Lexapro by her PCP last week.  Associated Signs/Symptoms: Depression Symptoms:  depressed mood, anhedonia, insomnia, hypersomnia, difficulty concentrating, hopelessness, suicidal attempt, anxiety, panic attacks, (Hypo) Manic Symptoms:  Distractibility, Impulsivity, Irritable Mood, Anxiety Symptoms:  Excessive Worry, Panic Symptoms, Psychotic Symptoms:   None PTSD Symptoms: NA Total Time spent with  patient: 1 hour  Past Psychiatric History: As above  Is the patient at risk to self? Yes.    Has the patient been a risk to self in the past 6 months? Yes.    Has the patient been a risk to self within the distant past? Yes.    Is the patient a risk to others? No.  Has the patient been a risk to others in the past 6 months? No.  Has the patient been a risk to others within the distant past? No.   [place] Scale:  Flowsheet Row Admission (Current) from 08/03/2022 in [facility] INPATIENT BEHAVIORAL MEDICINE ED from 08/02/2022 in [facility] Emergency Department at [facility]  C-SSRS RISK CATEGORY No Risk High Risk        Prior Inpatient Therapy: No. If yes, describe  Prior Outpatient Therapy: Yes.   If yes, describe as above  Alcohol Screening: 1. How often do you have a drink containing alcohol?: Monthly or less 2. How many drinks containing alcohol do you have on a typical day when you are drinking?: 1 or 2 3. How often do you have six or more drinks on one occasion?: Never AUDIT-C Score: 1 4. How often during the last year have you found that you were not able to stop drinking once you had started?: Never 5. How often during the last year have you failed to do what was normally expected from you because of drinking?: Never 6. How often during the last year have you needed a first drink in the morning to get yourself going after a heavy drinking session?: Never 7. How often during the last year have you had a feeling of guilt of remorse after drinking?: Never 8. How often during the last  year have you been unable to remember what happened the night before because you had been drinking?: Never 9. Have you or someone else been injured as a result of your drinking?: No 10. Has a relative or friend or a doctor or another health worker been concerned about your drinking or suggested you cut down?: No Alcohol Use Disorder Identification Test Final Score (AUDIT): 1 Alcohol Brief  Interventions/Follow-up: Alcohol education/Brief advice Substance Abuse History in the last 12 months:  Yes.   Consequences of Substance Abuse: NA Previous Psychotropic Medications: Yes  Psychological Evaluations: No  Past Medical History:  Past Medical History:  Diagnosis Date   Headache    SVT (supraventricular tachycardia)    Tachycardia     Past Surgical History:  Procedure Laterality Date   ABLATION     ELBOW SURGERY Right    Family History:  Family History  Problem Relation Age of Onset   Headache Father    Depression Neg Hx    Anxiety disorder Neg Hx    Bipolar disorder Neg Hx    Schizophrenia Neg Hx    ADD / ADHD Neg Hx    Autism Neg Hx    Family Psychiatric  History: Unremarkable Tobacco Screening:  Social History   Tobacco Use  Smoking Status Never  Smokeless Tobacco Never    BH Tobacco Counseling     Are you interested in Tobacco Cessation Medications?  N/A, patient does not use tobacco products Counseled patient on smoking cessation:  N/A, patient does not use tobacco products Reason Tobacco Screening Not Completed: No value filed.       Social History:  Social History   Substance and Sexual Activity  Alcohol Use No   Comment: social     Social History   Substance and Sexual Activity  Drug Use No    Additional Social History:                           Allergies:   Allergies  Allergen Reactions   Banana Nausea And Vomiting   Lab Results:  Results for orders placed or performed during the hospital encounter of 08/02/22 (from the past 48 hour(s))  Comprehensive metabolic panel     Status: Abnormal   Collection Time: 08/02/22  3:01 PM  Result Value Ref Range   Sodium 138 135 - 145 mmol/L   Potassium 3.6 3.5 - 5.1 mmol/L   Chloride 102 98 - 111 mmol/L   CO2 24 22 - 32 mmol/L   Glucose, Bld 91 70 - 99 mg/dL    Comment: Glucose reference range applies only to samples taken after fasting for at least 8 hours.   BUN 10 6 - 20  mg/dL   Creatinine, Ser 1.61 0.44 - 1.00 mg/dL   Calcium 9.2 8.9 - 09.6 mg/dL   Total Protein 7.3 6.5 - 8.1 g/dL   Albumin 4.5 3.5 - 5.0 g/dL   AST 13 (L) 15 - 41 U/L   ALT 9 0 - 44 U/L   Alkaline Phosphatase 55 38 - 126 U/L   Total Bilirubin 0.6 0.3 - 1.2 mg/dL   GFR, Estimated >04 >54 mL/min    Comment: (NOTE) Calculated using the CKD-EPI Creatinine Equation (2021)    Anion gap 12 5 - 15    Comment: Performed at [facility], [address] [place], [place] [address]  Ethanol     Status: None   Collection Time: 08/02/22  3:01  PM  Result Value Ref Range   Alcohol, Ethyl (B) <10 <10 mg/dL    Comment: (NOTE) Lowest detectable limit for serum alcohol is 10 mg/dL.  For medical purposes only. Performed at [facility], [address], [place], [place] [address]   Urine rapid drug screen (hosp performed)     Status: Abnormal   Collection Time: 08/02/22  3:01 PM  Result Value Ref Range   Opiates NONE DETECTED NONE DETECTED   Cocaine NONE DETECTED NONE DETECTED   Benzodiazepines POSITIVE (A) NONE DETECTED   Amphetamines NONE DETECTED NONE DETECTED   Tetrahydrocannabinol POSITIVE (A) NONE DETECTED   Barbiturates NONE DETECTED NONE DETECTED    Comment: (NOTE) DRUG SCREEN FOR MEDICAL PURPOSES ONLY.  IF CONFIRMATION IS NEEDED FOR ANY PURPOSE, NOTIFY LAB WITHIN 5 DAYS.  LOWEST DETECTABLE LIMITS FOR URINE DRUG SCREEN Drug Class                     Cutoff (ng/mL) Amphetamine and metabolites    1000 Barbiturate and metabolites    200 Benzodiazepine                 200 Opiates and metabolites        300 Cocaine and metabolites        300 THC                            50 Performed at [facility], [address], [place], [place] [address]   CBC with Diff     Status: None   Collection Time: 08/02/22  3:01 PM  Result Value Ref Range   WBC 9.7 4.0 - 10.5 K/uL   RBC 4.52 3.87 - 5.11 MIL/uL   Hemoglobin 13.3 12.0 -  15.0 g/dL   HCT 09.8 11.9 - 14.7 %   MCV 87.4 80.0 - 100.0 fL   MCH 29.4 26.0 - 34.0 pg   MCHC 33.7 30.0 - 36.0 g/dL   RDW 82.9 56.2 - 13.0 %   Platelets 378 150 - 400 K/uL   nRBC 0.0 0.0 - 0.2 %   Neutrophils Relative % 69 %   Neutro Abs 6.7 1.7 - 7.7 K/uL   Lymphocytes Relative 25 %   Lymphs Abs 2.4 0.7 - 4.0 K/uL   Monocytes Relative 6 %   Monocytes Absolute 0.5 0.1 - 1.0 K/uL   Eosinophils Relative 0 %   Eosinophils Absolute 0.0 0.0 - 0.5 K/uL   Basophils Relative 0 %   Basophils Absolute 0.0 0.0 - 0.1 K/uL   Immature Granulocytes 0 %   Abs Immature Granulocytes 0.02 0.00 - 0.07 K/uL    Comment: Performed at [facility], [address] [place], [place] [address]  Pregnancy, urine     Status: None   Collection Time: 08/02/22  3:01 PM  Result Value Ref Range   Preg Test, Ur NEGATIVE NEGATIVE    Comment:        THE SENSITIVITY OF THIS METHODOLOGY IS >25 mIU/mL. Performed at [facility], [address], [place], [place] [address]   Salicylate level     Status: Abnormal   Collection Time: 08/02/22  3:01 PM  Result Value Ref Range   Salicylate Lvl <7.0 (L) 7.0 - 30.0 mg/dL    Comment: Performed at [facility], [address] [place], [place] [address]  Acetaminophen level     Status: Abnormal   Collection Time:  08/02/22  3:01 PM  Result Value Ref Range   Acetaminophen (Tylenol), Serum <10 (L) 10 - 30 ug/mL    Comment: (NOTE) Therapeutic concentrations vary significantly. A range of 10-30 ug/mL  may be an effective concentration for many patients. However, some  are best treated at concentrations outside of this range. Acetaminophen concentrations >150 ug/mL at 4 hours after ingestion  and >50 ug/mL at 12 hours after ingestion are often associated with  toxic reactions.  Performed at [facility], [address], [place], [place] [address]   Troponin I (High Sensitivity)     Status:  None   Collection Time: 08/02/22  3:01 PM  Result Value Ref Range   Troponin I (High Sensitivity) <2 <18 ng/L    Comment: (NOTE) Elevated high sensitivity troponin I (hsTnI) values and significant  changes across serial measurements may suggest ACS but many other  chronic and acute conditions are known to elevate hsTnI results.  Refer to the "Links" section for chest pain algorithms and additional  guidance. Performed at [facility], [address] [place], [place] [address]     Blood Alcohol level:  Lab Results  Component Value Date   [facility] <10 08/02/2022    Metabolic Disorder Labs:  No results found for: "HGBA1C", "MPG" No results found for: "PROLACTIN" No results found for: "CHOL", "TRIG", "HDL", "CHOLHDL", "VLDL", "LDLCALC"  Current Medications: Current Facility-Administered Medications  Medication Dose Route Frequency Provider Last Rate Last Admin   acetaminophen (TYLENOL) tablet 650 mg  650 mg Oral Q6H PRN Onuoha, Chinwendu V, NP       albuterol (VENTOLIN HFA) 108 (90 Base) MCG/ACT inhaler 1-2 puff  1-2 puff Inhalation Q6H PRN Onuoha, Chinwendu V, NP       alum & mag hydroxide-simeth (MAALOX/MYLANTA) 200-200-20 MG/5ML suspension 30 mL  30 mL Oral Q4H PRN Onuoha, Chinwendu V, NP       FLUoxetine (PROZAC) capsule 20 mg  20 mg Oral Daily Sarina Ill, DO       LORazepam (ATIVAN) tablet 0.5 mg  0.5 mg Oral Q4H PRN Sarina Ill, DO       magnesium hydroxide (MILK OF MAGNESIA) suspension 30 mL  30 mL Oral Daily PRN Onuoha, Chinwendu V, NP       OLANZapine (ZYPREXA) tablet 10 mg  10 mg Oral Q6H PRN Sarina Ill, DO       risperiDONE (RISPERDAL) tablet 0.5 mg  0.5 mg Oral [id number] Amberleigh Gerken Edward, DO       traZODone (DESYREL) tablet 50 mg  50 mg Oral QHS PRN Sarina Ill, DO       PTA Medications: Medications Prior to Admission  Medication Sig Dispense Refill Last Dose   albuterol (VENTOLIN HFA) 108 (90  Base) MCG/ACT inhaler       ALPRAZolam (XANAX) 0.25 MG tablet Take 0.25 mg by mouth at bedtime as needed for anxiety.      escitalopram (LEXAPRO) 10 MG tablet Take 10 mg by mouth daily.      ibuprofen (ADVIL,MOTRIN) 200 MG tablet Take 200 mg by mouth every 6 (six) hours as needed for headache.      ondansetron (ZOFRAN) 4 MG tablet Take 1 tablet (4 mg total) by mouth every 6 (six) hours as needed for nausea or vomiting. Dissolve under tongue every 6-8 hours as needed. 30 tablet 1     Musculoskeletal: Strength & Muscle Tone: within normal limits Gait & Station: normal Patient leans: N/A  Psychiatric Specialty Exam:  Presentation  General Appearance: No data recorded Eye Contact:No data recorded Speech:No data recorded Speech Volume:No data recorded Handedness:No data recorded  Mood and Affect  Mood:No data recorded Affect:No data recorded  Thought Process  Thought Processes:No data recorded Duration of Psychotic Symptoms:N/A Past Diagnosis of Schizophrenia or Psychoactive disorder: No  Descriptions of Associations:No data recorded Orientation:No data recorded Thought Content:No data recorded Hallucinations:No data recorded Ideas of Reference:No data recorded Suicidal Thoughts:No data recorded Homicidal Thoughts:No data recorded  Sensorium  Memory:No data recorded Judgment:No data recorded Insight:No data recorded  Executive Functions  Concentration:No data recorded Attention Span:No data recorded Recall:No data recorded Fund of Knowledge:No data recorded Language:No data recorded  Psychomotor Activity  Psychomotor Activity:No data recorded  Assets  Assets:No data recorded  Sleep  Sleep:No data recorded   Physical Exam: Physical Exam Constitutional:      Appearance: Normal appearance.  HENT:     Head: Normocephalic and atraumatic.     Mouth/Throat:     Pharynx: Oropharynx is clear.  Eyes:     Pupils: Pupils are equal, round, and  reactive to light.  Cardiovascular:     Rate and Rhythm: Normal rate and regular rhythm.  Pulmonary:     Effort: Pulmonary effort is normal.     Breath sounds: Normal breath sounds.  Abdominal:     General: Abdomen is flat.     Palpations: Abdomen is soft.  Musculoskeletal:        General: Normal range of motion.  Skin:    General: Skin is warm and dry.  Neurological:     General: No focal deficit present.     Mental Status: She is alert. Mental status is at baseline.  Psychiatric:        Attention and Perception: Attention and perception normal.        Mood and Affect: Mood is anxious and depressed. Affect is flat and tearful.        Speech: Speech normal.        Behavior: Behavior is withdrawn. Behavior is cooperative.        Thought Content: Thought content includes suicidal ideation.        Cognition and Memory: Cognition and memory normal.        Judgment: Judgment is impulsive.    Review of Systems  Constitutional: Negative.   HENT: Negative.    Eyes: Negative.   Respiratory: Negative.    Cardiovascular: Negative.   Gastrointestinal: Negative.   Genitourinary: Negative.   Musculoskeletal: Negative.   Skin: Negative.   Neurological: Negative.   Endo/Heme/Allergies: Negative.   Psychiatric/Behavioral:  Positive for depression and suicidal ideas. The patient is nervous/anxious and has insomnia.    Blood pressure 117/81, pulse 78, temperature 97.7 F (36.5 C), temperature source Oral, resp. rate 16, height 5\' 2"  (1.575 m), weight 61.7 kg, last menstrual period 07/30/2022, SpO2 100%. Body mass index is 24.87 kg/m.  Treatment Plan Summary: Daily contact with patient to assess and evaluate symptoms and progress in treatment, Medication management, and Plan change Lexapro to Prozac and start low-dose Risperdal and trazodone as needed Ativan as needed instead of Xanax.  Observation Level/Precautions:  15 minute checks  Laboratory:  CBC Chemistry Profile  Psychotherapy:     Medications:    Consultations:    Discharge Concerns:    Estimated LOS:  Other:     Physician Treatment Plan for Primary Diagnosis: Major depressive disorder, recurrent episode, severe (HCC) Long Term Goal(s): Improvement in symptoms so  as ready for discharge  Short Term Goals: Ability to identify changes in lifestyle to reduce recurrence of condition will improve, Ability to verbalize feelings will improve, Ability to disclose and discuss suicidal ideas, Ability to demonstrate self-control will improve, Ability to identify and develop effective coping behaviors will improve, Ability to maintain clinical measurements within normal limits will improve, Compliance with prescribed medications will improve, and Ability to identify triggers associated with substance abuse/mental health issues will improve  Physician Treatment Plan for Secondary Diagnosis: Principal Problem:   Major depressive disorder, recurrent episode, severe, 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    Sarina Ill, DO 7/31/20241:19 PM

## 2022-08-03 NOTE — Plan of Care (Signed)
Patient new to the unit tonight, hasn't had time to progress  Problem: Education: Goal: Knowledge of General Education information will improve Description: Including pain rating scale, medication(s)/side effects and non-pharmacologic comfort measures Outcome: Not Progressing   Problem: Health Behavior/Discharge Planning: Goal: Ability to manage health-related needs will improve Outcome: Not Progressing   Problem: Clinical Measurements: Goal: Ability to maintain clinical measurements within normal limits will improve Outcome: Not Progressing Goal: Will remain free from infection Outcome: Not Progressing Goal: Diagnostic test results will improve Outcome: Not Progressing Goal: Respiratory complications will improve Outcome: Not Progressing Goal: Cardiovascular complication will be avoided Outcome: Not Progressing   Problem: Activity: Goal: Risk for activity intolerance will decrease Outcome: Not Progressing   Problem: Nutrition: Goal: Adequate nutrition will be maintained Outcome: Not Progressing   Problem: Coping: Goal: Level of anxiety will decrease Outcome: Not Progressing   Problem: Elimination: Goal: Will not experience complications related to bowel motility Outcome: Not Progressing Goal: Will not experience complications related to urinary retention Outcome: Not Progressing   Problem: Pain Managment: Goal: General experience of comfort will improve Outcome: Not Progressing   Problem: Safety: Goal: Ability to remain free from injury will improve Outcome: Not Progressing   Problem: Skin Integrity: Goal: Risk for impaired skin integrity will decrease Outcome: Not Progressing   Problem: Education: Goal: Knowledge of [place] [facility] Education information/materials will improve Outcome: Not Progressing Goal: Emotional status will improve Outcome: Not Progressing Goal: Mental status will improve Outcome: Not Progressing Goal: Verbalization of  understanding the information provided will improve Outcome: Not Progressing   Problem: Activity: Goal: Interest or engagement in activities will improve Outcome: Not Progressing Goal: Sleeping patterns will improve Outcome: Not Progressing   Problem: Coping: Goal: Ability to verbalize frustrations and anger appropriately will improve Outcome: Not Progressing Goal: Ability to demonstrate self-control will improve Outcome: Not Progressing   Problem: Health Behavior/Discharge Planning: Goal: Identification of resources available to assist in meeting health care needs will improve Outcome: Not Progressing Goal: Compliance with treatment plan for underlying cause of condition will improve Outcome: Not Progressing   Problem: Physical Regulation: Goal: Ability to maintain clinical measurements within normal limits will improve Outcome: Not Progressing   Problem: Safety: Goal: Periods of time without injury will increase Outcome: Not Progressing   Problem: Education: Goal: Utilization of techniques to improve thought processes will improve Outcome: Not Progressing Goal: Knowledge of the prescribed therapeutic regimen will improve Outcome: Not Progressing   Problem: Activity: Goal: Interest or engagement in leisure activities will improve Outcome: Not Progressing Goal: Imbalance in normal sleep/wake cycle will improve Outcome: Not Progressing   Problem: Coping: Goal: Coping ability will improve Outcome: Not Progressing Goal: Will verbalize feelings Outcome: Not Progressing   Problem: Health Behavior/Discharge Planning: Goal: Ability to make decisions will improve Outcome: Not Progressing Goal: Compliance with therapeutic regimen will improve Outcome: Not Progressing   Problem: Role Relationship: Goal: Will demonstrate positive changes in social behaviors and relationships Outcome: Not Progressing   Problem: Education: Goal: Knowledge of [place] [facility] Education  information/materials will improve Outcome: Not Progressing Goal: Emotional status will improve Outcome: Not Progressing Goal: Mental status will improve Outcome: Not Progressing Goal: Verbalization of understanding the information provided will improve Outcome: Not Progressing   Problem: Activity: Goal: Interest or engagement in activities will improve Outcome: Not Progressing Goal: Sleeping patterns will improve Outcome: Not Progressing   Problem: Coping: Goal: Ability to verbalize frustrations and anger appropriately will improve Outcome: Not Progressing Goal: Ability to demonstrate self-control  will improve Outcome: Not Progressing   Problem: Health Behavior/Discharge Planning: Goal: Identification of resources available to assist in meeting health care needs will improve Outcome: Not Progressing Goal: Compliance with treatment plan for underlying cause of condition will improve Outcome: Not Progressing   Problem: Physical Regulation: Goal: Ability to maintain clinical measurements within normal limits will improve Outcome: Not Progressing   Problem: Safety: Goal: Periods of time without injury will increase Outcome: Not Progressing

## 2022-08-03 NOTE — Progress Notes (Signed)
Patient admitted from [facility], report received from [place], [place]. Pt calm and cooperative during assessment denying SI/HI/AVH. Pt stated she recently broke up with S/O and has been feeling depressed ans having SI which led her to coming to the hospital. Pt stated she wasn't having any SI at the time of admission. Pt oriented to the unit and her room. Skin assessment completed with Marylu Lund, RN. Pt has healed cuts to L forearm and L thigh. No other abnormalities found. No contraband found. Pt given education, support, and encouragement to be active in her treatment plan. Pt being monitored Q 15 minutes for safety per unit protocol, remains safe on the unit

## 2022-08-03 NOTE — Group Note (Signed)
Date:  08/03/2022 Time:  10:10 AM  Group Topic/Focus:  Dimensions of Wellness:   The focus of this group is to introduce the topic of wellness and discuss the role each dimension of wellness plays in total health.    Participation Level:  Did Not Attend   Crickett Abbett 08/03/2022, 10:10 AM

## 2022-08-03 NOTE — BHH Suicide Risk Assessment (Signed)
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  She broke up with her girlfriend about 2 weeks ago and moved and with her parents with whom she has problems with at times.  Because of this she ran off to [place] to be with other family but her anxiety and depression has worsened and she started cutting on herself.  She has a history of cutting on herself since age 8.  She has a history of supraventricular tachycardia with 2 ablations.  She has seen a therapist 3 times but did not connect but is willing to see a psychiatrist and a different therapist.  She is able to contract for safety in the hospital.  She endorses anhedonia, difficulty sleeping or sleeping too much, anxiety, depressed mood, mood swings, uncontrollable tearfulness, and self-harm.  She has never seen a psychiatrist and has never been psychiatrically hospitalized.  She is willing to try new medications.  UDS is positive for marijuana.  She just started on Xanax and Lexapro by her PCP last week.   Continued Clinical Symptoms:  Alcohol Use Disorder Identification Test Final Score (AUDIT): 1 The "Alcohol Use Disorders Identification Test", Guidelines for Use in Primary Care, Second Edition.  World Science writer [facility]). Score between 0-7:  no or low risk or alcohol related problems. Score between 8-15:  moderate risk of alcohol  related problems. Score between 16-19:  high risk of alcohol related problems. Score 20 or above:  warrants further diagnostic evaluation for alcohol dependence and treatment.   CLINICAL FACTORS:   Severe Anxiety and/or Agitation Depression:   Hopelessness Impulsivity Personality Disorders:   Cluster B   Musculoskeletal: Strength & Muscle Tone: within normal limits Gait & Station: normal Patient leans: N/A  Psychiatric Specialty Exam:  Presentation  General Appearance: No data recorded Eye Contact:No data recorded Speech:No data recorded Speech Volume:No data recorded Handedness:No data recorded  Mood and Affect  Mood:No data recorded Affect:No data recorded  Thought Process  Thought Processes:No data recorded Descriptions of Associations:No data recorded Orientation:No data recorded Thought Content:No data recorded History of Schizophrenia/Schizoaffective disorder:No  Duration of Psychotic Symptoms:No data recorded Hallucinations:No data recorded Ideas of Reference:No data recorded Suicidal Thoughts:No data recorded Homicidal Thoughts:No data recorded  Sensorium  Memory:No data recorded Judgment:No data recorded Insight:No data recorded  Executive Functions  Concentration:No data recorded Attention Span:No data recorded Recall:No data recorded Fund of Knowledge:No data recorded Language:No data recorded  Psychomotor Activity  Psychomotor Activity:No data recorded  Assets  Assets:No data recorded  Sleep  Sleep:No data recorded    Blood pressure 117/81, pulse 78, temperature 97.7 F (36.5 C), temperature source Oral, resp. rate 16, height 5\' 2"  (1.575 m), weight 61.7 kg, last menstrual period 07/30/2022, SpO2 100%. Body mass index is 24.87 kg/m.   COGNITIVE FEATURES THAT CONTRIBUTE TO RISK:  None    SUICIDE RISK:   Mild:  Suicidal ideation of limited frequency, intensity, duration, and specificity.  There are no identifiable plans, no  associated intent, mild dysphoria and related symptoms, good self-control (both objective and subjective assessment), few other risk factors, and identifiable protective factors, including available and accessible social support.  PLAN OF CARE: See orders  I certify that inpatient services furnished can reasonably be expected to improve the patient's condition.   Latifa Noble [place], DO 08/03/2022, 1:20 PM

## 2022-08-03 NOTE — Group Note (Signed)
Date:  08/03/2022 Time:  4:04 PM  Group Topic/Focus:  OUTDOOR RECREATION. SOCIALIZATION AND RAPPORT BUILDING    Participation Level:  Active  Participation Quality:  Appropriate and Attentive  Affect:  Appropriate  Cognitive:  Alert and Appropriate  Insight: Appropriate  Engagement in Group:  Engaged and Supportive  Modes of Intervention:  Activity, Rapport Building, Socialization, and Support  Additional Comments:    [name] 08/03/2022, 4:04 PM
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
## 2022-08-03 NOTE — Progress Notes (Signed)
   08/03/22 1600  Spiritual Encounters  Type of Visit Initial  Care provided to: Patient  Referral source Chaplain assessment  Reason for visit Routine spiritual support  OnCall Visit No  Spiritual Framework  Presenting Themes Meaning/purpose/sources of inspiration;Goals in life/care;Values and beliefs;Caregiving needs;Coping tools;Impactful experiences and emotions;Courage hope and growth;Community and relationships  Community/Connection Family  Patient Stress Factors Family relationships;Major life changes  Family Stress Factors Family relationships  Interventions  Spiritual Care Interventions Made Established relationship of care and support;Compassionate presence;Reflective listening;Self-care teaching;Encouragement  Intervention Outcomes  Outcomes Awareness around self/spiritual resourses;Autonomy/agency;Awareness of support;Reduced isolation   While doing routine visits chaplain met with patient in the day room. Patient shared with me that she came here because of suicidal feelings. Patient told me that she recently had a breakup after 4 years, dealing with alcoholism in her family and also feelings of isolation. Chaplain offered words of encouragement and hope and encouraged patient to write down things that bring her joy. Patient shared with chaplin different traumatic things that she has been through that has brought her here. Chaplain will continue to check on the patient.
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
## 2022-08-03 NOTE — Progress Notes (Signed)
Nursing Shift Note:  1900-0700  Attended Evening Group: No Medication Compliant:  N/A Behavior: isolative Sleep Quality: fair - nightmare Significant Changes: Pt was tearful on AM assessment.  Pt states she had a nightmare.  Pt states she is ready to go home but wants to feel better first.   08/03/22 2300  Psychosocial Assessment  Patient Complaints Anxiety;Depression  Eye Contact Fair  Facial Expression Flat  Affect Sad  Speech Logical/coherent  Interaction Assertive  Motor Activity Slow  Appearance/Hygiene Unremarkable  Behavior Characteristics Cooperative  Mood Depressed  Thought Process  Coherency WDL  Content WDL  Delusions None reported or observed  Perception WDL  Hallucination None reported or observed  Judgment Poor  Confusion None  Danger to Self  Current suicidal ideation? Denies  Danger to Others  Danger to Others None reported or observed

## 2022-08-04 DIAGNOSIS — F332 Major depressive disorder, recurrent severe without psychotic features: Secondary | ICD-10-CM | POA: Diagnosis not present

## 2022-08-04 NOTE — Progress Notes (Signed)
   08/04/22 0816  Psych Admission Type (Psych Patients Only)  Admission Status Voluntary  Psychosocial Assessment  Patient Complaints Anxiety  Eye Contact Avoids  Facial Expression Flat;Sad  Affect Depressed  Speech Logical/coherent  Interaction Assertive  Motor Activity Slow  Appearance/Hygiene In scrubs;Unremarkable  Behavior Characteristics Cooperative  Mood Depressed;Preoccupied  Thought Process  Coherency WDL  Content WDL  Delusions None reported or observed  Perception WDL  Hallucination None reported or observed  Judgment Poor  Confusion None  Danger to Self  Current suicidal ideation? Denies  Agreement Not to Harm Self Yes  Description of Agreement Verbal  Danger to Others  Danger to Others None reported or observed   Patient is alert and oriented X4.  Patient denies SI/HI/AVH.  Patient rates her depression as 8/10.  Patient is sad and tearful at times.  Crying about a dream that she had about her love interest had moved on so quickly.  Supportive listening provided.  Patient states her appetite has been poor.  Patient has spent time on the phone with family and friends.  Patient has spent time in the milieu.  Patient continues on Q 15 minute checks.

## 2022-08-04 NOTE — Group Note (Signed)
Date:  08/04/2022 Time:  10:19 AM  Group Topic/Focus:  Crisis Planning:   The purpose of this group is to help patients create a crisis plan for use upon discharge or in the future, as needed.    Participation Level:  Active  Participation Quality:  Appropriate, Attentive, and Sharing  Affect:  Appropriate  Cognitive:  Alert, Appropriate, and Oriented  Insight: Appropriate  Engagement in Group:  Engaged and Improving  Modes of Intervention:  Rapport Building, Socialization, and Support  Additional Comments:    Maria [name] 08/04/2022, 10:19 AM

## 2022-08-04 NOTE — Group Note (Signed)
Recreation Therapy Group Note   Group Topic:Healthy Support Systems  Group Date: 08/04/2022 Start Time: 1010 End Time: 1100 Facilitators: Rosina Lowenstein, LRT, CTRS Location:  Craft Room  Group Description: Straw Bridge. Individually, patients were given 10 plastic drinking straws and an equal length of masking tape. Using the materials provided, patients were instructed to build a free-standing bridge-like structure to suspend an everyday item (ex: puzzle box or deck of cards) off the floor or table surface. All materials were required to be used in Secondary school teacher. LRT facilitated post-activity discussion reviewing how we, humans, are like the structure we built; when things get too heavy in our life and we do not have adequate supports/coping skills, then we will fall just like the straw-built structure will. LRT focused on how having a "base" or structure on the bottom was necessary for the object to stand, meaning we must be secure and stable first before building on ourselves or others. Patients were encouraged to name 2 healthy supports in their life and reflect on how the skills used in this activity can be generalized to daily life post discharge.   Goal Area(s) Addressed:  Patient will identify two healthy support systems in their life. Patient will work on Product manager. Patient will verbalize the importance of having a strong and steady "base".  Patient will follow multi-step directions. Patients will engage in creativity and use all provided materials.   Affect/Mood: Appropriate   Participation Level: Active and Engaged   Participation Quality: Independent   Behavior: Calm and Cooperative   Speech/Thought Process: Coherent   Insight: Good   Judgement: Good   Modes of Intervention: Activity   Patient Response to Interventions:  Attentive, Engaged, Interested , and Receptive   Education Outcome:  Acknowledges education   Clinical  Observations/Individualized Feedback: Maria [name] was active in their participation of session activities and group discussion. Pt identified "my god mother and music" as her healthy supports. Pt interacted well with LRT and peers duration of session.   Plan: Continue to engage patient in RT group sessions 2-3x/week.   Rosina Lowenstein, LRT, CTRS 08/04/2022 11:17 AM

## 2022-08-04 NOTE — Plan of Care (Signed)
  Problem: Pain Managment: Goal: General experience of comfort will improve Outcome: Progressing   Problem: Safety: Goal: Periods of time without injury will increase Outcome: Progressing

## 2022-08-04 NOTE — [facility] Counselor (Signed)
CSW spoke with the patient's mother.  Mother requests phone call from the patient's provider.  CSW has informed Dr. Marlou Porch and provided contact information via secure chat.  Penni Homans, MSW, LCSW 08/04/2022 9:43 AM

## 2022-08-04 NOTE — Plan of Care (Signed)
  Problem: Education: Goal: Knowledge of General Education information will improve Description: Including pain rating scale, medication(s)/side effects and non-pharmacologic comfort measures Outcome: Progressing   Problem: Nutrition: Goal: Adequate nutrition will be maintained 08/04/2022 2234 by Weldon Picking, RN Outcome: Progressing 08/04/2022 2104 by Weldon Picking, RN Outcome: Progressing   Problem: Coping: Goal: Level of anxiety will decrease 08/04/2022 2234 by Weldon Picking, RN Outcome: Progressing 08/04/2022 2104 by Weldon Picking, RN Outcome: Progressing   Problem: Safety: Goal: Ability to remain free from injury will improve 08/04/2022 2234 by Weldon Picking, RN Outcome: Progressing 08/04/2022 2104 by Weldon Picking, RN Outcome: Progressing

## 2022-08-04 NOTE — Plan of Care (Signed)

## 2022-08-04 NOTE — Progress Notes (Signed)
[facility] MD Progress Note  08/04/2022 12:35 PM Maria [name]  MRN:  [id number] Subjective:  Maria [name] is seen on rounds.  Her affect is improved.  She states that she is feeling much better.  She had a good conversation with her mother and would like me to discuss her medications with her.  She denies any side effects from the medications.  She states that she slept pretty well last night but did have some bad dreams.  She is benefiting from groups and interacting with staff and peers.  She is able to contract for safety in the hospital.  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) Active Problems:   Anxiety and depression  Total Time spent with patient: 15 minutes  Past Psychiatric History: Depression for the past year  Past Medical History:  Past Medical History:  Diagnosis Date   Headache    SVT (supraventricular tachycardia)    Tachycardia     Past Surgical History:  Procedure Laterality Date   ABLATION     ELBOW SURGERY Right    Family History:  Family History  Problem Relation Age of Onset   Headache Father    Depression Neg Hx    Anxiety disorder Neg Hx    Bipolar disorder Neg Hx    Schizophrenia Neg Hx    ADD / ADHD Neg Hx    Autism Neg Hx    Family Psychiatric  History: Unremarkable Social History:  Social History   Substance and Sexual Activity  Alcohol Use No   Comment: social     Social History   Substance and Sexual Activity  Drug Use No    Social History   Socioeconomic History   Marital status: Single    Spouse name: Not on file   Number of children: Not on file   Years of education: Not on file   Highest education level: Not on file  Occupational History   Not on file  Tobacco Use   Smoking status: Never   Smokeless tobacco: Never  Substance and Sexual Activity   Alcohol use: No    Comment: social    Drug use: No   Sexual activity: Never  Other Topics Concern   Not on file  Social History Narrative   ** Merged History Encounter **       Sashay is in the 11th grade at [employer]. she does well in school. She lives with her parents, grandparents, and sister.       She plays volleyball.    Social Determinants of Health   Financial Resource Strain: Not on file  Food Insecurity: No Food Insecurity (08/03/2022)   Hunger Vital Sign    Worried About Running Out of Food in the Last Year: Never true    Ran Out of Food in the Last Year: Never true  Transportation Needs: No Transportation Needs (08/03/2022)   PRAPARE - Administrator, Civil Service (Medical): No    Lack of Transportation (Non-Medical): No  Physical Activity: Not on file  Stress: Not on file  Social Connections: Not on file   Additional Social History:                         Sleep: Good  Appetite:  Good  Current Medications: Current Facility-Administered Medications  Medication Dose Route Frequency Provider Last Rate Last Admin   acetaminophen (  TYLENOL) tablet 650 mg  650 mg Oral Q6H PRN Onuoha, Chinwendu V, NP       albuterol (VENTOLIN HFA) 108 (90 Base) MCG/ACT inhaler 1-2 puff  1-2 puff Inhalation Q6H PRN Onuoha, Chinwendu V, NP       alum & mag hydroxide-simeth (MAALOX/MYLANTA) 200-200-20 MG/5ML suspension 30 mL  30 mL Oral Q4H PRN Onuoha, Chinwendu V, NP       FLUoxetine (PROZAC) capsule 20 mg  20 mg Oral Daily Sarina Ill, DO   20 mg at 08/04/22 0816   LORazepam (ATIVAN) tablet 0.5 mg  0.5 mg Oral Q4H PRN Sarina Ill, DO       magnesium hydroxide (MILK OF MAGNESIA) suspension 30 mL  30 mL Oral Daily PRN Onuoha, Chinwendu V, NP       OLANZapine (ZYPREXA) tablet 10 mg  10 mg Oral Q6H PRN Sarina Ill, DO       risperiDONE (RISPERDAL) tablet 0.5 mg  0.5 mg Oral [id number] Sarina Ill, DO   0.5 mg at 08/04/22 0816   traZODone (DESYREL)  tablet 50 mg  50 mg Oral QHS PRN Sarina Ill, DO        Lab Results:  Results for orders placed or performed during the hospital encounter of 08/02/22 (from the past 48 hour(s))  Comprehensive metabolic panel     Status: Abnormal   Collection Time: 08/02/22  3:01 PM  Result Value Ref Range   Sodium 138 135 - 145 mmol/L   Potassium 3.6 3.5 - 5.1 mmol/L   Chloride 102 98 - 111 mmol/L   CO2 24 22 - 32 mmol/L   Glucose, Bld 91 70 - 99 mg/dL    Comment: Glucose reference range applies only to samples taken after fasting for at least 8 hours.   BUN 10 6 - 20 mg/dL   Creatinine, Ser 7.25 0.44 - 1.00 mg/dL   Calcium 9.2 8.9 - 36.6 mg/dL   Total Protein 7.3 6.5 - 8.1 g/dL   Albumin 4.5 3.5 - 5.0 g/dL   AST 13 (L) 15 - 41 U/L   ALT 9 0 - 44 U/L   Alkaline Phosphatase 55 38 - 126 U/L   Total Bilirubin 0.6 0.3 - 1.2 mg/dL   GFR, Estimated >44 >03 mL/min    Comment: (NOTE) Calculated using the CKD-EPI Creatinine Equation (2021)    Anion gap 12 5 - 15    Comment: Performed at [facility], [address] [place], [place] [address]  Ethanol     Status: None   Collection Time: 08/02/22  3:01 PM  Result Value Ref Range   Alcohol, Ethyl (B) <10 <10 mg/dL    Comment: (NOTE) Lowest detectable limit for serum alcohol is 10 mg/dL.  For medical purposes only. Performed at [facility], [address], [place], [place] [address]   Urine rapid drug screen (hosp performed)     Status: Abnormal   Collection Time: 08/02/22  3:01 PM  Result Value Ref Range   Opiates NONE DETECTED NONE DETECTED   Cocaine NONE DETECTED NONE DETECTED   Benzodiazepines POSITIVE (A) NONE DETECTED   Amphetamines NONE DETECTED NONE DETECTED   Tetrahydrocannabinol POSITIVE (A) NONE DETECTED   Barbiturates NONE DETECTED NONE DETECTED    Comment: (NOTE) DRUG SCREEN FOR MEDICAL PURPOSES ONLY.  IF CONFIRMATION IS NEEDED FOR ANY PURPOSE, NOTIFY LAB WITHIN 5  DAYS.  LOWEST DETECTABLE LIMITS FOR URINE DRUG SCREEN Drug Class  Cutoff (ng/mL) Amphetamine and metabolites    1000 Barbiturate and metabolites    200 Benzodiazepine                 200 Opiates and metabolites        300 Cocaine and metabolites        300 THC                            50 Performed at [facility], [address], [place], [place] [address]   CBC with Diff     Status: None   Collection Time: 08/02/22  3:01 PM  Result Value Ref Range   WBC 9.7 4.0 - 10.5 K/uL   RBC 4.52 3.87 - 5.11 MIL/uL   Hemoglobin 13.3 12.0 - 15.0 g/dL   HCT 91.4 78.2 - 95.6 %   MCV 87.4 80.0 - 100.0 fL   MCH 29.4 26.0 - 34.0 pg   MCHC 33.7 30.0 - 36.0 g/dL   RDW 21.3 08.6 - 57.8 %   Platelets 378 150 - 400 K/uL   nRBC 0.0 0.0 - 0.2 %   Neutrophils Relative % 69 %   Neutro Abs 6.7 1.7 - 7.7 K/uL   Lymphocytes Relative 25 %   Lymphs Abs 2.4 0.7 - 4.0 K/uL   Monocytes Relative 6 %   Monocytes Absolute 0.5 0.1 - 1.0 K/uL   Eosinophils Relative 0 %   Eosinophils Absolute 0.0 0.0 - 0.5 K/uL   Basophils Relative 0 %   Basophils Absolute 0.0 0.0 - 0.1 K/uL   Immature Granulocytes 0 %   Abs Immature Granulocytes 0.02 0.00 - 0.07 K/uL    Comment: Performed at [facility], [address] [place], [place] [address]  Pregnancy, urine     Status: None   Collection Time: 08/02/22  3:01 PM  Result Value Ref Range   Preg Test, Ur NEGATIVE NEGATIVE    Comment:        THE SENSITIVITY OF THIS METHODOLOGY IS >25 mIU/mL. Performed at [facility], [address] [place], [place] [address]   Salicylate level     Status: Abnormal   Collection Time: 08/02/22  3:01 PM  Result Value Ref Range   Salicylate Lvl <7.0 (L) 7.0 - 30.0 mg/dL    Comment: Performed at [facility], [address], [place], [place] [address]  Acetaminophen level     Status: Abnormal   Collection Time: 08/02/22  3:01 PM   Result Value Ref Range   Acetaminophen (Tylenol), Serum <10 (L) 10 - 30 ug/mL    Comment: (NOTE) Therapeutic concentrations vary significantly. A range of 10-30 ug/mL  may be an effective concentration for many patients. However, some  are best treated at concentrations outside of this range. Acetaminophen concentrations >150 ug/mL at 4 hours after ingestion  and >50 ug/mL at 12 hours after ingestion are often associated with  toxic reactions.  Performed at [facility], [address], [place], [place] [address]   Troponin I (High Sensitivity)     Status: None   Collection Time: 08/02/22  3:01 PM  Result Value Ref Range   Troponin I (High Sensitivity) <2 <18 ng/L    Comment: (NOTE) Elevated high sensitivity troponin I (hsTnI) values and significant  changes across serial measurements may suggest ACS but many other  chronic and acute conditions are known to elevate hsTnI results.  Refer to the "Links"  section for chest pain algorithms and additional  guidance. Performed at [facility], [address], [place], [place] [address]     Blood Alcohol level:  Lab Results  Component Value Date   [facility] <10 08/02/2022    Metabolic Disorder Labs: No results found for: "HGBA1C", "MPG" No results found for: "PROLACTIN" No results found for: "CHOL", "TRIG", "HDL", "CHOLHDL", "VLDL", "LDLCALC"  Physical Findings: AIMS:  , ,  ,  ,    CIWA:    COWS:     Musculoskeletal: Strength & Muscle Tone: within normal limits Gait & Station: normal Patient leans: N/A  Psychiatric Specialty Exam:  Presentation  General Appearance: No data recorded Eye Contact:No data recorded Speech:No data recorded Speech Volume:No data recorded Handedness:No data recorded  Mood and Affect  Mood:No data recorded Affect:No data recorded  Thought Process  Thought Processes:No data recorded Descriptions of Associations:No data recorded Orientation:No data  recorded Thought Content:No data recorded History of Schizophrenia/Schizoaffective disorder:No  Duration of Psychotic Symptoms:No data recorded Hallucinations:No data recorded Ideas of Reference:No data recorded Suicidal Thoughts:No data recorded Homicidal Thoughts:No data recorded  Sensorium  Memory:No data recorded Judgment:No data recorded Insight:No data recorded  Executive Functions  Concentration:No data recorded Attention Span:No data recorded Recall:No data recorded Fund of Knowledge:No data recorded Language:No data recorded  Psychomotor Activity  Psychomotor Activity:No data recorded  Assets  Assets:No data recorded  Sleep  Sleep:No data recorded    Blood pressure 125/89, pulse (!) 125, temperature 98.6 F (37 C), temperature source Oral, resp. rate 19, height 5\' 2"  (1.575 m), weight 61.7 kg, last menstrual period 07/30/2022, SpO2 100%. Body mass index is 24.87 kg/m.   Treatment Plan Summary: Daily contact with patient to assess and evaluate symptoms and progress in treatment, Medication management, and Plan continue current medications.  Sarina Ill, DO 08/04/2022, 12:35 PM

## 2022-08-04 NOTE — Group Note (Signed)
Date:  08/04/2022 Time:  9:18 PM  Group Topic/Focus:  Wrap-Up Group:   The focus of this group is to help patients review their daily goal of treatment and discuss progress on daily workbooks.    Participation Level:  Active  Participation Quality:  Appropriate and Attentive  Affect:  Appropriate and Excited  Cognitive:  Alert and Appropriate  Insight: Appropriate and Good  Engagement in Group:  Developing/Improving and Engaged  Modes of Intervention:  Clarification, Education, Rapport Building, and Socialization  Additional Comments:     Elina Streng 08/04/2022, 9:18 PM

## 2022-08-04 NOTE — Progress Notes (Signed)
   08/04/22 1400  Spiritual Encounters  Type of Visit Follow up  Care provided to: Patient  Referral source Chaplain assessment  Reason for visit Routine spiritual support  OnCall Visit No  Spiritual Framework  Presenting Themes Community and relationships;Meaning/purpose/sources of inspiration  Community/Connection Family  Patient Stress Factors Family relationships  Family Stress Factors Family relationships  Interventions  Spiritual Care Interventions Made Compassionate presence;Reflective listening;Encouragement;Decision-making support/facilitation  Intervention Outcomes  Outcomes Awareness around self/spiritual resourses;Autonomy/agency;Patient family open to resources   Chaplain followed up with patient to check on her progress and how she is feeling today. Patient said she was feeling better today and that she is focusing on getting better so that she can leave. She is looking forward to saving her money and moving into her own apartment. Chaplain also printed out a prayer for the patient on heartbreak to provide strength and support.

## 2022-08-04 NOTE — Group Note (Signed)
Date:  08/04/2022 Time:  10:45 AM  Group Topic/Focus:  OUTDOOR RECREATION    Participation Level:  Active  Participation Quality:  Appropriate and Attentive  Affect:  Appropriate and Excited  Cognitive:  Alert and Appropriate  Insight: Appropriate  Engagement in Group:  Improving  Modes of Intervention:  Activity, Rapport Building, and Socialization  Additional Comments:    [name] 08/04/2022, 10:45 AM

## 2022-08-04 NOTE — Progress Notes (Signed)
Patient is pleasant and cooperative. Denies SI, HI, AVh. Reports feeling much better than yesterday. Spent most of early shift on phone. Visible in milieu.  Encouragement and support provided. Safety checks maintained. No meds ordered this shift. patient remains safe on unit with q 15 min checks.

## 2022-08-04 NOTE — [facility] Suicide Risk Assessment (Signed)
[facility] INPATIENT:  Family/Significant Other Suicide Prevention Education  Suicide Prevention Education:  Education Completed; Geralyn Flash Huthcinson, mother, [phone] has been identified by the patient as the family member/significant other with whom the patient will be residing, and identified as the person(s) who will aid the patient in the event of a mental health crisis (suicidal ideations/suicide attempt).  With written consent from the patient, the family member/significant other has been provided the following suicide prevention education, prior to the and/or following the discharge of the patient.  The suicide prevention education provided includes the following: Suicide risk factors Suicide prevention and interventions National Suicide Hotline telephone number [facility] assessment telephone number [facility] Emergency Assistance 911 [facility] and/or Residential Mobile Crisis Unit telephone number  Request made of family/significant other to: Remove weapons (e.g., guns, rifles, knives), all items previously/currently identified as safety concern.   Remove drugs/medications (over-the-counter, prescriptions, illicit drugs), all items previously/currently identified as a safety concern.  The family member/significant other verbalizes understanding of the suicide prevention education information provided.  The family member/significant other agrees to remove the items of safety concern listed above.  Mother reports "she has some depression that is causing some physical issues".  Mother reports that "this has been going on for awhile, 4-5 years".  Mother reports that she is just coming aware of the patient's depression.  Mother reports "she might be a danger to herself".  Mother reports that the patient does not have access to weapons, weapons are in the home however, mother reports that they are secured.    Harden Mo 08/04/2022, 9:37 AM

## 2022-08-05 MED ORDER — AQUAPHOR EX OINT: TOPICAL_OINTMENT | CUTANEOUS | Status: DC | PRN

## 2022-08-05 NOTE — Plan of Care (Signed)
  Problem: Education: Goal: Knowledge of General Education information will improve Description: Including pain rating scale, medication(s)/side effects and non-pharmacologic comfort measures Outcome: Progressing   Problem: Clinical Measurements: Goal: Respiratory complications will improve Outcome: Progressing   Problem: Nutrition: Goal: Adequate nutrition will be maintained Outcome: Progressing   Problem: Coping: Goal: Ability to verbalize frustrations and anger appropriately will improve Outcome: Progressing

## 2022-08-05 NOTE — Progress Notes (Signed)
[facility] MD Progress Note  08/05/2022 2:21 PM Jereline Ticer  MRN:  [id number] Subjective:  Maria [name] is seen on rounds.  She is doing much better.  She is not crying anymore.  She is sleeping better and eating better.  She is interacting and laughing with staff and peers.  She feels that she is ready to be discharged.  Talked to her about discharge tomorrow.  She is good with that.  She denies any side effects from the medications. 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  No psychiatrist.  Past Medical History:  Past Medical History:  Diagnosis Date   Headache    SVT (supraventricular tachycardia)    Tachycardia     Past Surgical History:  Procedure Laterality Date   ABLATION     ELBOW SURGERY Right    Family History:  Family History  Problem Relation Age of Onset   Headache Father    Depression Neg Hx    Anxiety disorder Neg Hx    Bipolar disorder Neg Hx    Schizophrenia Neg Hx    ADD / ADHD Neg Hx    Autism Neg Hx    Family Psychiatric  History: Unremarkable Social History:  Social History   Substance and Sexual Activity  Alcohol Use No   Comment: social     Social History   Substance and Sexual Activity  Drug Use No    Social History   Socioeconomic History   Marital status: Single    Spouse name: Not on file   Number of children: Not on file   Years of education: Not on file   Highest education level: Not on file  Occupational History   Not on file  Tobacco Use   Smoking status: Never   Smokeless tobacco: Never  Substance and Sexual Activity   Alcohol use: No    Comment: social   Drug use: No   Sexual activity: Never  Other Topics Concern   Not on file  Social  History Narrative   ** Merged History Encounter **       Jia is in the 11th grade at [employer]. she does well in school. She lives with her parents, grandparents, and sister.       She plays volleyball.    Social Determinants of Health   Financial Resource Strain: Not on file  Food Insecurity: No Food Insecurity (08/03/2022)   Hunger Vital Sign    Worried About Running Out of Food in the Last Year: Never true    Ran Out of Food in the Last Year: Never true  Transportation Needs: No Transportation Needs (08/03/2022)   PRAPARE - Administrator, Civil Service (Medical): No    Lack of Transportation (Non-Medical): No  Physical Activity: Not on file  Stress: Not on file  Social Connections: Not on file   Additional Social History:                         Sleep: Good  Appetite:  Good  Current Medications: Current Facility-Administered Medications  Medication Dose Route Frequency Provider Last Rate Last Admin   acetaminophen (TYLENOL) tablet 650 mg  650 mg Oral Q6H PRN Onuoha, Chinwendu V, NP  albuterol (VENTOLIN HFA) 108 (90 Base) MCG/ACT inhaler 1-2 puff  1-2 puff Inhalation Q6H PRN Onuoha, Chinwendu V, NP       alum & mag hydroxide-simeth (MAALOX/MYLANTA) 200-200-20 MG/5ML suspension 30 mL  30 mL Oral Q4H PRN Onuoha, Chinwendu V, NP       FLUoxetine (PROZAC) capsule 20 mg  20 mg Oral Daily Sarina Ill, DO   20 mg at 08/05/22 0825   LORazepam (ATIVAN) tablet 0.5 mg  0.5 mg Oral Q4H PRN Sarina Ill, DO       magnesium hydroxide (MILK OF MAGNESIA) suspension 30 mL  30 mL Oral Daily PRN Onuoha, Chinwendu V, NP       mineral oil-hydrophilic petrolatum (AQUAPHOR) ointment   Topical PRN Sarina Ill, DO       OLANZapine [facility]) tablet 10 mg  10 mg Oral Q6H PRN Sarina Ill, DO       risperiDONE (RISPERDAL) tablet 0.5 mg  0.5 mg Oral BH-[id number] Sarina Ill, DO   0.5 mg at 08/05/22 4010    traZODone (DESYREL) tablet 50 mg  50 mg Oral QHS PRN Sarina Ill, DO        Lab Results: No results found for this or any previous visit (from the past 48 hour(s)).  Blood Alcohol level:  Lab Results  Component Value Date   ETH <10 08/02/2022    Metabolic Disorder Labs: No results found for: "HGBA1C", "MPG" No results found for: "PROLACTIN" No results found for: "CHOL", "TRIG", "HDL", "CHOLHDL", "VLDL", "LDLCALC"  Physical Findings: AIMS:  , ,  ,  ,    CIWA:    COWS:     Musculoskeletal: Strength & Muscle Tone: within normal limits Gait & Station: normal Patient leans: N/A  Psychiatric Specialty Exam:  Presentation  General Appearance: No data recorded Eye Contact:No data recorded Speech:No data recorded Speech Volume:No data recorded Handedness:No data recorded  Mood and Affect  Mood:No data recorded Affect:No data recorded  Thought Process  Thought Processes:No data recorded Descriptions of Associations:No data recorded Orientation:No data recorded Thought Content:No data recorded History of Schizophrenia/Schizoaffective disorder:No  Duration of Psychotic Symptoms:No data recorded Hallucinations:No data recorded Ideas of Reference:No data recorded Suicidal Thoughts:No data recorded Homicidal Thoughts:No data recorded  Sensorium  Memory:No data recorded Judgment:No data recorded Insight:No data recorded  Executive Functions  Concentration:No data recorded Attention Span:No data recorded Recall:No data recorded Fund of Knowledge:No data recorded Language:No data recorded  Psychomotor Activity  Psychomotor Activity:No data recorded  Assets  Assets:No data recorded  Sleep  Sleep:No data recorded    Blood pressure 91/69, pulse (!) 120, temperature 98.3 F (36.8 C), temperature source Oral, resp. rate 18, height 5\' 2"  (1.575 m), weight 61.7 kg, last menstrual period 07/30/2022, SpO2 99%. Body mass index is 24.87 kg/m.   Treatment  Plan Summary: Daily contact with patient to assess and evaluate symptoms and progress in treatment, Medication management, and Plan continue current medications.  Discharge tomorrow.  Sarina Ill, DO 08/05/2022, 2:21 PM

## 2022-08-05 NOTE — Group Note (Signed)
Recreation Therapy Group Note   Group Topic:Leisure Education  Group Date: 08/05/2022 Start Time: 1030 End Time: 1130 Facilitators: Rosina Lowenstein, LRT, CTRS Location:  Craft Room  Group Description: Leisure. Patients were given the option to choose from coloring, singing karaoke, making origami, or playing cards. LRT and pts discussed the meaning of leisure, the importance of participating in leisure during their free time/when they're outside of the hospital, as well as how our leisure interests can also serve as coping skills. Pt identified two leisure interests and shared with the group.   Goal Area(s) Addressed:  Patient will identify a current leisure interest.  Patient will learn the definition of "leisure". Patient will practice making a positive decision. Patient will have the opportunity to try a new leisure activity. Patient will communicate with peers and LRT.    Affect/Mood: Appropriate   Participation Level: Active and Engaged   Participation Quality: Independent   Behavior: Appropriate, Calm, and Cooperative   Speech/Thought Process: Coherent   Insight: Good   Judgement: Good   Modes of Intervention: Activity   Patient Response to Interventions:  Attentive, Engaged, Interested , and Receptive   Education Outcome:  Acknowledges education   Clinical Observations/Individualized Feedback: Sunday was active in their participation of session activities and group discussion. Pt identified "listen to music and draw" as things she does in her free time. Pt chose to draw while in group. Pt interacted well with LRT and peers duration of session.   Plan: Continue to engage patient in RT group sessions 2-3x/week.   Rosina Lowenstein, LRT, CTRS 08/05/2022 12:33 PM

## 2022-08-05 NOTE — BHH Counselor (Signed)
CSW again reached out to the psychiatrist, pts mother is requesting a return call.  CSW sent secure chat with contact information.  Penni Homans, MSW, LCSW 08/05/2022 10:43 AM

## 2022-08-05 NOTE — Progress Notes (Signed)
   08/05/22 0825  Psych Admission Type (Psych Patients Only)  Admission Status Voluntary  Psychosocial Assessment  Patient Complaints Anxiety  Eye Contact Fair  Facial Expression Animated  Affect Depressed  Speech Logical/coherent  Interaction Assertive  Motor Activity Slow  Appearance/Hygiene In scrubs;Unremarkable  Behavior Characteristics Cooperative  Mood Pleasant  Thought Process  Coherency WDL  Content WDL  Delusions None reported or observed  Perception WDL  Hallucination None reported or observed  Judgment Limited  Confusion None  Danger to Self  Current suicidal ideation? Denies  Agreement Not to Harm Self Yes  Description of Agreement Verbal  Danger to Others  Danger to Others None reported or observed   Patient is alert and oriented X4.  Patient denies SI/HI/AVH.  Patient denies pain.  Patient is pleasant and cooperative.  Patient continues on Q 15 minute checks.

## 2022-08-06 MED ORDER — RISPERIDONE 0.5 MG PO TABS: 0.5000 mg | ORAL_TABLET | ORAL | 3 refills | Status: DC

## 2022-08-06 MED ORDER — FLUOXETINE HCL 20 MG PO CAPS: 20.0000 mg | ORAL_CAPSULE | Freq: Every day | ORAL | 3 refills | Status: AC

## 2022-08-06 MED ORDER — TRAZODONE HCL 50 MG PO TABS: 50.0000 mg | ORAL_TABLET | Freq: Every evening | ORAL | 3 refills | Status: AC | PRN

## 2022-08-06 NOTE — BHH Suicide Risk Assessment (Signed)
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) Active Problems:   Anxiety and depression   Total Time spent with patient: 1 hour  Musculoskeletal: Strength & Muscle Tone: within normal limits Gait & Station: normal Patient leans: N/A  Psychiatric Specialty Exam  Presentation  General Appearance: No data recorded Eye Contact:No data recorded Speech:No data recorded Speech Volume:No data recorded Handedness:No data recorded  Mood and Affect  Mood:No data recorded Duration of Depression Symptoms: Greater than two weeks  Affect:No data recorded  Thought Process  Thought Processes:No data recorded Descriptions of Associations:No data recorded Orientation:No data recorded Thought Content:No data recorded History of Schizophrenia/Schizoaffective disorder:No  Duration of Psychotic Symptoms:No data recorded Hallucinations:No data recorded Ideas of Reference:No data recorded Suicidal Thoughts:No data recorded Homicidal Thoughts:No data recorded  Sensorium  Memory:No data recorded Judgment:No data recorded Insight:No data recorded  Executive Functions  Concentration:No data recorded Attention Span:No data recorded Recall:No data recorded Fund of Knowledge:No data recorded Language:No data recorded  Psychomotor Activity  Psychomotor Activity:No data recorded  Assets  Assets:No data recorded  Sleep  Sleep:No data recorded  Physical Exam: Physical Exam ROS Blood pressure 104/78, pulse (!) 113, temperature 98.2 F (36.8 C), temperature source Oral, resp. rate 18, height 5\' 2"  (1.575 m), weight 61.7 kg, last menstrual period 07/30/2022, SpO2 100%. Body mass index is 24.87 kg/m.  Mental Status Per Nursing Assessment::   On Admission:  NA  Demographic Factors:  NA  Loss  Factors: Loss of significant relationship  Historical Factors: NA  Risk Reduction Factors:   NA  Continued Clinical Symptoms:  Depression:   Anhedonia  Cognitive Features That Contribute To Risk:  None    Suicide Risk:  Minimal: No identifiable suicidal ideation.  Patients presenting with no risk factors but with morbid ruminations; may be classified as minimal risk based on the severity of the depressive symptoms   Follow-up Information     [facility], [facility] Follow up.   Why: Walk ins are from 8AM to [facility] Monday, Wednesday and Friday Contact information: [address] [place] [address] [phone]                 Plan Of Care/Follow-up recommendations: [facility]   Sarina Ill, DO 08/06/2022, 10:48 AM

## 2022-08-06 NOTE — Plan of Care (Signed)
D- Patient alert and oriented. Pt is pleasant and cooperative Denies SI, HI, AVH, and pain. No acute distress.. A- Scheduled medications administered to patient, per MD orders. Support and encouragement provided.  Routine safety checks conducted every 15 minutes.  Patient informed to notify staff with problems or concerns. R- No adverse drug reactions noted. Patient contracts for safety at this time. Patient compliant with medications and treatment plan. Patient receptive, calm, and cooperative. Patient interacts well with others on the unit.  Patient remains safe at this time.

## 2022-08-06 NOTE — Discharge Summary (Signed)
Physician Discharge Summary Note  Patient:  Maria [name] is an 22 y.o., female MRN:  [id number] DOB:  April 08, 2000 Patient phone:  [phone] (home)  Patient address:   [address] [place] [facility] [address],  Total Time spent with patient: 1 hour  Date of Admission:  08/03/2022 Date of Discharge: 08/06/2022  Reason for Admission:  Maria [name] is a 22 year old African-American female who was voluntarily admitted to psychiatry after presenting to the emergency room with chest pain and depression.  She broke up with her girlfriend about 2 weeks ago and moved and with her parents with whom she has problems with at times.  Because of this she ran off to [place] to be with other family but her anxiety and depression has worsened and she started cutting on herself.  She has a history of cutting on herself since age 54.  She has a history of supraventricular tachycardia with 2 ablations.  She has seen a therapist 3 times but did not connect but is willing to see a psychiatrist and a different therapist.  She is able to contract for safety in the hospital.  She endorses anhedonia, difficulty sleeping or sleeping too much, anxiety, depressed mood, mood swings, uncontrollable tearfulness, and self-harm.  She has never seen a psychiatrist and has never been psychiatrically hospitalized.  She is willing to try new medications.  UDS is positive for marijuana.  She just started on Xanax and Lexapro by her PCP last week.   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  Past Medical History:  Past Medical History:  Diagnosis Date   Headache    SVT (supraventricular tachycardia)    Tachycardia     Past Surgical History:  Procedure  Laterality Date   ABLATION     ELBOW SURGERY Right    Family History:  Family History  Problem Relation Age of Onset   Headache Father    Depression Neg Hx    Anxiety disorder Neg Hx    Bipolar disorder Neg Hx    Schizophrenia Neg Hx    ADD / ADHD Neg Hx    Autism Neg Hx    Family Psychiatric  History: Unremarkable Social History:  Social History   Substance and Sexual Activity  Alcohol Use No   Comment: social     Social History   Substance and Sexual Activity  Drug Use No    Social History   Socioeconomic History   Marital status: Single    Spouse name: Not on file   Number of children: Not on file   Years of education: Not on file   Highest education level: Not on file  Occupational History   Not on file  Tobacco Use   Smoking status: Never   Smokeless tobacco: Never  Substance and Sexual Activity   Alcohol use: No    Comment: social   Drug use: No   Sexual activity: Never  Other Topics Concern   Not on file  Social History Narrative   ** Merged History Encounter **       Maria [name] is in the 11th grade at [employer]. she does well in school. She lives with her parents, grandparents, and sister.       She plays volleyball.    Social  Determinants of Health   Financial Resource Strain: Not on file  Food Insecurity: No Food Insecurity (08/03/2022)   Hunger Vital Sign    Worried About Running Out of Food in the Last Year: Never true    Ran Out of Food in the Last Year: Never true  Transportation Needs: No Transportation Needs (08/03/2022)   PRAPARE - Administrator, Civil Service (Medical): No    Lack of Transportation (Non-Medical): No  Physical Activity: Not on file  Stress: Not on file  Social Connections: Not on file    Hospital Course:  Maria [name] was voluntarily admitted to inpatient psychiatry for worsening depression and suicidal ideation.  She had had a significant break-up with a significant other.  They were living together.   She has been having depression for the last couple years.  No suicide attempts.  While on the inpatient unit she was initiated on her home medication of Lexapro which was changed to Prozac because she was having such a hard time with her emotions.  Risperdal 0.5 mg twice a day was added to help speed up the process which she did.  Trazodone was used as needed for sleep.  She tolerated the medications without any problems.  There were no side effects.  She denied any problems with that.  She interacted well with staff and peers.  Outpatient appointments were made.  It was felt that she maximized hospitalization she was discharged home.  On the day of discharge she denied suicidal ideation, homicidal ideation, auditory or visual hallucinations.  Her judgment and insight were good.  Physical Findings: AIMS:  , ,  ,  ,    CIWA:    COWS:     Musculoskeletal: Strength & Muscle Tone: within normal limits Gait & Station: normal Patient leans: N/A   Psychiatric Specialty Exam:  Presentation  General Appearance: No data recorded Eye Contact:No data recorded Speech:No data recorded Speech Volume:No data recorded Handedness:No data recorded  Mood and Affect  Mood:No data recorded Affect:No data recorded  Thought Process  Thought Processes:No data recorded Descriptions of Associations:No data recorded Orientation:No data recorded Thought Content:No data recorded History of Schizophrenia/Schizoaffective disorder:No  Duration of Psychotic Symptoms:No data recorded Hallucinations:No data recorded Ideas of Reference:No data recorded Suicidal Thoughts:No data recorded Homicidal Thoughts:No data recorded  Sensorium  Memory:No data recorded Judgment:No data recorded Insight:No data recorded  Executive Functions  Concentration:No data recorded Attention Span:No data recorded Recall:No data recorded Fund of Knowledge:No data recorded Language:No data recorded  Psychomotor Activity   Psychomotor Activity:No data recorded  Assets  Assets:No data recorded  Sleep  Sleep:No data recorded   Physical Exam: Physical Exam Vitals and nursing note reviewed.  Constitutional:      Appearance: Normal appearance. She is normal weight.  Neurological:     General: No focal deficit present.     Mental Status: She is alert and oriented to person, place, and time.  Psychiatric:        Attention and Perception: Attention and perception normal.        Mood and Affect: Mood and affect normal.        Speech: Speech normal.        Behavior: Behavior normal. Behavior is cooperative.        Thought Content: Thought content normal.        Cognition and Memory: Cognition and memory normal.        Judgment: Judgment normal.    Review of Systems  Constitutional:  Negative.   HENT: Negative.    Eyes: Negative.   Respiratory: Negative.    Cardiovascular: Negative.   Gastrointestinal: Negative.   Genitourinary: Negative.   Musculoskeletal: Negative.   Skin: Negative.   Neurological: Negative.   Endo/Heme/Allergies: Negative.   Psychiatric/Behavioral: Negative.     Blood pressure 104/78, pulse (!) 113, temperature 98.2 F (36.8 C), temperature source Oral, resp. rate 18, height 5\' 2"  (1.575 m), weight 61.7 kg, last menstrual period 07/30/2022, SpO2 100%. Body mass index is 24.87 kg/m.   Social History   Tobacco Use  Smoking Status Never  Smokeless Tobacco Never   Tobacco Cessation:  N/A, patient does not currently use tobacco products   Blood Alcohol level:  Lab Results  Component Value Date   ETH <10 08/02/2022    Metabolic Disorder Labs:  Lab Results  Component Value Date   HGBA1C 5.5 08/05/2022   MPG 111.15 08/05/2022   No results found for: "PROLACTIN" Lab Results  Component Value Date   CHOL 121 08/05/2022   TRIG 58 08/05/2022   HDL 42 08/05/2022   CHOLHDL 2.9 08/05/2022   VLDL 12 08/05/2022   LDLCALC 67 08/05/2022    See Psychiatric Specialty  Exam and Suicide Risk Assessment completed by Attending Physician prior to discharge.  Discharge destination:  Home  Is patient on multiple antipsychotic therapies at discharge:  No   Has Patient had three or more failed trials of antipsychotic monotherapy by history:  No  Recommended Plan for Multiple Antipsychotic Therapies: NA   Allergies as of 08/06/2022       Reactions   Banana Nausea And Vomiting        Medication List     STOP taking these medications    ALPRAZolam 0.25 MG tablet Commonly known as: XANAX   escitalopram 10 MG tablet Commonly known as: LEXAPRO   ibuprofen 200 MG tablet Commonly known as: ADVIL   ondansetron 4 MG tablet Commonly known as: ZOFRAN   Ventolin HFA 108 (90 Base) MCG/ACT inhaler Generic drug: albuterol       TAKE these medications      Indication  FLUoxetine 20 MG capsule Commonly known as: PROZAC Take 1 capsule (20 mg total) by mouth daily. Start taking on: August 07, 2022  Indication: Depression   risperiDONE 0.5 MG tablet Commonly known as: RISPERDAL Take 1 tablet (0.5 mg total) by mouth 2 (two) times daily at 8 am and 4 pm.  Indication: Major Depressive Disorder   traZODone 50 MG tablet Commonly known as: DESYREL Take 1 tablet (50 mg total) by mouth at bedtime as needed for sleep.  Indication: Trouble Sleeping, Major Depressive Disorder        Follow-up Information     [facility], [facility] Follow up.   Why: Walk ins are from 8AM to [facility] Monday, Wednesday and Friday Contact information: [address] [place] [address] [phone]                 Follow-up recommendations: [facility] or Her choosing.     Signed: Sarina Ill, DO 08/06/2022, 10:51 AM

## 2022-08-06 NOTE — Progress Notes (Signed)
  [facility] Adult Case Management Discharge Plan :  Will you be returning to the same living situation after discharge:  Yes, [address] [place] [address]  At discharge, do you have transportation home?: Yes,  The patients father will be picking her up. Do you have the ability to pay for your medications: Yes,  The patient stated that she will be able to pay for medications.  Release of information consent forms completed and in the chart;  Patient's signature needed at discharge.  Patient to Follow up at:  Follow-up Information     [facility], [facility] Follow up.   Why: Walk ins are from 8AM to [facility] Monday, Wednesday and Friday Contact information: [address] [place] [address] [phone]                 Next level of care provider has access to [facility] Link:yes  Safety Planning and Suicide Prevention discussed: Yes,  Waverly Ferrari, mother, [phone]     Has patient been referred to the Quitline?: Patient refused referral for treatment  Patient has been referred for addiction treatment: No known substance use disorder.  Marshell Levan, LCSW 08/06/2022, 11:47 AM

## 2022-08-06 NOTE — Group Note (Signed)
Date:  08/06/2022 Time:  1:44 AM  Group Topic/Focus:  Goals Group:   The focus of this group is to help patients establish daily goals to achieve during treatment and discuss how the patient can incorporate goal setting into their daily lives to aide in recovery.    Participation Level:  Active  Participation Quality:  Appropriate  Affect:  Appropriate  Cognitive:  Appropriate  Insight: Good  Engagement in Group:  Engaged  Modes of Intervention:  Discussion  Additional Comments:  [name] 08/06/2022, 1:44 AM

## 2022-08-12 DIAGNOSIS — F4321 Adjustment disorder with depressed mood: Secondary | ICD-10-CM | POA: Diagnosis not present

## 2022-08-15 DIAGNOSIS — F4321 Adjustment disorder with depressed mood: Secondary | ICD-10-CM | POA: Diagnosis not present

## 2022-08-17 DIAGNOSIS — R110 Nausea: Secondary | ICD-10-CM | POA: Diagnosis not present

## 2022-08-17 DIAGNOSIS — F4321 Adjustment disorder with depressed mood: Secondary | ICD-10-CM | POA: Diagnosis not present

## 2022-08-22 DIAGNOSIS — F4321 Adjustment disorder with depressed mood: Secondary | ICD-10-CM | POA: Diagnosis not present

## 2022-08-23 DIAGNOSIS — Z01419 Encounter for gynecological examination (general) (routine) without abnormal findings: Secondary | ICD-10-CM | POA: Diagnosis not present

## 2022-08-23 DIAGNOSIS — Z124 Encounter for screening for malignant neoplasm of cervix: Secondary | ICD-10-CM | POA: Diagnosis not present

## 2022-08-23 DIAGNOSIS — Z6824 Body mass index (BMI) 24.0-24.9, adult: Secondary | ICD-10-CM | POA: Diagnosis not present

## 2022-08-23 DIAGNOSIS — N926 Irregular menstruation, unspecified: Secondary | ICD-10-CM | POA: Diagnosis not present

## 2022-08-24 DIAGNOSIS — F4321 Adjustment disorder with depressed mood: Secondary | ICD-10-CM | POA: Diagnosis not present

## 2022-08-29 DIAGNOSIS — F4321 Adjustment disorder with depressed mood: Secondary | ICD-10-CM | POA: Diagnosis not present

## 2022-08-30 ENCOUNTER — Other Ambulatory Visit: Payer: Self-pay | Admitting: Obstetrics and Gynecology

## 2022-08-30 DIAGNOSIS — E221 Hyperprolactinemia: Secondary | ICD-10-CM

## 2022-08-31 DIAGNOSIS — F4321 Adjustment disorder with depressed mood: Secondary | ICD-10-CM | POA: Diagnosis not present

## 2022-09-06 ENCOUNTER — Encounter: Payer: Self-pay | Admitting: Obstetrics and Gynecology

## 2022-09-07 ENCOUNTER — Emergency Department (HOSPITAL_COMMUNITY): Payer: BC Managed Care – PPO

## 2022-09-07 ENCOUNTER — Other Ambulatory Visit (HOSPITAL_BASED_OUTPATIENT_CLINIC_OR_DEPARTMENT_OTHER): Payer: Self-pay

## 2022-09-07 ENCOUNTER — Other Ambulatory Visit: Payer: Self-pay

## 2022-09-07 ENCOUNTER — Emergency Department (HOSPITAL_BASED_OUTPATIENT_CLINIC_OR_DEPARTMENT_OTHER)
Admission: EM | Admit: 2022-09-07 | Discharge: 2022-09-07 | Disposition: A | Discharge status: Home / Self Care | Payer: BC Managed Care – PPO | Attending: Emergency Medicine | Admitting: Emergency Medicine

## 2022-09-07 ENCOUNTER — Encounter (HOSPITAL_BASED_OUTPATIENT_CLINIC_OR_DEPARTMENT_OTHER): Payer: Self-pay | Admitting: Emergency Medicine

## 2022-09-07 DIAGNOSIS — R519 Headache, unspecified: Secondary | ICD-10-CM | POA: Diagnosis not present

## 2022-09-07 DIAGNOSIS — R55 Syncope and collapse: Secondary | ICD-10-CM | POA: Insufficient documentation

## 2022-09-07 DIAGNOSIS — D352 Benign neoplasm of pituitary gland: Secondary | ICD-10-CM | POA: Diagnosis not present

## 2022-09-07 DIAGNOSIS — R202 Paresthesia of skin: Secondary | ICD-10-CM | POA: Diagnosis not present

## 2022-09-07 DIAGNOSIS — O9279 Other disorders of lactation: Secondary | ICD-10-CM

## 2022-09-07 DIAGNOSIS — N926 Irregular menstruation, unspecified: Secondary | ICD-10-CM | POA: Insufficient documentation

## 2022-09-07 LAB — URINALYSIS, ROUTINE W REFLEX MICROSCOPIC
Bilirubin Urine: NEGATIVE
Glucose, UA: NEGATIVE mg/dL
Hgb urine dipstick: NEGATIVE
Ketones, ur: NEGATIVE mg/dL
Leukocytes,Ua: NEGATIVE
Nitrite: NEGATIVE
Protein, ur: NEGATIVE mg/dL
Specific Gravity, Urine: 1.022 (ref 1.005–1.030)
pH: 7.5 (ref 5.0–8.0)

## 2022-09-07 LAB — PREGNANCY, URINE: Preg Test, Ur: NEGATIVE

## 2022-09-07 LAB — CBC
HCT: 38.3 % (ref 36.0–46.0)
Hemoglobin: 12.6 g/dL (ref 12.0–15.0)
MCH: 29.5 pg (ref 26.0–34.0)
MCHC: 32.9 g/dL (ref 30.0–36.0)
MCV: 89.7 fL (ref 80.0–100.0)
Platelets: 369 10*3/uL (ref 150–400)
RBC: 4.27 MIL/uL (ref 3.87–5.11)
RDW: 12.9 % (ref 11.5–15.5)
WBC: 7 10*3/uL (ref 4.0–10.5)
nRBC: 0 % (ref 0.0–0.2)

## 2022-09-07 LAB — BASIC METABOLIC PANEL
Anion gap: 6 (ref 5–15)
BUN: 9 mg/dL (ref 6–20)
CO2: 28 mmol/L (ref 22–32)
Calcium: 8.7 mg/dL — ABNORMAL LOW (ref 8.9–10.3)
Chloride: 105 mmol/L (ref 98–111)
Creatinine, Ser: 0.68 mg/dL (ref 0.44–1.00)
GFR, Estimated: >60 mL/min (ref >60)
Glucose, Bld: 98 mg/dL (ref 70–99)
Potassium: 3.9 mmol/L (ref 3.5–5.1)
Sodium: 139 mmol/L (ref 135–145)

## 2022-09-07 LAB — CBG MONITORING, ED: Glucose-Capillary: 97 mg/dL (ref 70–99)

## 2022-09-07 MED ORDER — GADOBUTROL 1 MMOL/ML IV SOLN
4.0000 mL | Freq: Once | INTRAVENOUS | Status: AC
  Administered 2022-09-07: 4 mL via INTRAVENOUS

## 2022-09-07 NOTE — ED Triage Notes (Signed)
Pt has mri scheduled for the 11th, she is being evaluated for pituitary problems. She has been on her menses for a month and has nipple discharge.  Today presents with headache for 2 days, when she was in the shower and bent over, she felt like she was going to pass out (tongue tinlgy, hands tingly and vision was turning black). She is not sure if she passed out but thinks she did lose a block of time, when she became aware she was crunched down in the tub, symptoms had resolved.

## 2022-09-07 NOTE — ED Notes (Signed)
Dc instructions reviewed with patient. Patient voiced understanding. Dc with belongings.  °

## 2022-09-07 NOTE — ED Notes (Signed)
Pt states still can not void, is drinking ginger ale

## 2022-09-07 NOTE — ED Provider Notes (Signed)
[place] EMERGENCY DEPARTMENT AT [facility] Provider Note   CSN: [id number] Arrival date & time: 09/07/22  1761     History  Chief Complaint  Patient presents with   Near Syncope    Maria [name] is a 22 y.o. female history of migraines, anxiety depression presented with menorrhagia for the past month with lactation along with a syncopal episode earlier today.  She states that she has had a headache the past 2 days however this morning when she was showering she bent down and when she went to stand back up she began to feel numb everywhere including her tongue, endorsed tunnel vision and had to sit down for to resolve.  Patient denies LOC, blood thinner use, fevers, recent illnesses, nauseous vomiting, recent head trauma.  Patient was seen at [facility] earlier today however was erroneously discharged and encouraged to return to the ER for an emergent MRI to rule out pituitary pathology.  Patient denied chest pain, shortness of breath, abdominal pain, changes in peripheral vision  Home Medications Prior to Admission medications   Medication Sig Start Date End Date Taking? Authorizing Provider  FLUoxetine (PROZAC) 20 MG capsule Take 1 capsule (20 mg total) by mouth daily. 08/07/22   Sarina Ill, DO  risperiDONE (RISPERDAL) 0.5 MG tablet Take 1 tablet (0.5 mg total) by mouth 2 (two) times daily at 8 am and 4 pm. 08/06/22   Sarina Ill, DO  traZODone (DESYREL) 50 MG tablet Take 1 tablet (50 mg total) by mouth at bedtime as needed for sleep. 08/06/22   Sarina Ill, DO      Allergies    Banana    Review of Systems   Review of Systems  Physical Exam Updated Vital Signs BP 96/61 (BP Location: Right Arm)   Pulse 83   Temp 97.7 F (36.5 C) (Oral)   Resp 18   Wt 61.2 kg   SpO2 100%   BMI 24.69 kg/m  Physical Exam Vitals reviewed.  Constitutional:      General: She is not in acute distress. HENT:     Head: Normocephalic and  atraumatic.  Eyes:     Extraocular Movements: Extraocular movements intact.     Conjunctiva/sclera: Conjunctivae normal.     Pupils: Pupils are equal, round, and reactive to light.  Cardiovascular:     Rate and Rhythm: Normal rate and regular rhythm.     Pulses: Normal pulses.     Heart sounds: Normal heart sounds.     Comments: 2+ bilateral radial/dorsalis pedis pulses with regular rate Pulmonary:     Effort: Pulmonary effort is normal. No respiratory distress.     Breath sounds: Normal breath sounds.  Abdominal:     Palpations: Abdomen is soft.     Tenderness: There is no abdominal tenderness. There is no guarding or rebound.  Musculoskeletal:        General: Normal range of motion.     Cervical back: Normal range of motion and neck supple.     Comments: 5 out of 5 bilateral grip/leg extension strength  Skin:    General: Skin is warm and dry.     Capillary Refill: Capillary refill takes less than 2 seconds.  Neurological:     General: No focal deficit present.     Mental Status: She is alert and oriented to person, place, and time.     Comments: Sensation intact in all 4 limbs  Psychiatric:        Mood  and Affect: Mood normal.     ED Results / Procedures / Treatments   Labs (all labs ordered are listed, but only abnormal results are displayed) Labs Reviewed  BASIC METABOLIC PANEL - Abnormal; Notable for the following components:      Result Value   Calcium 8.7 (*)    All other components within normal limits  CBC  URINALYSIS, ROUTINE W REFLEX MICROSCOPIC  PREGNANCY, URINE  CBG MONITORING, ED    EKG EKG Interpretation Date/Time:  Wednesday September 07 2022 08:44:59 EDT Ventricular Rate:  80 PR Interval:  130 QRS Duration:  76 QT Interval:  355 QTC Calculation: 410 R Axis:   77  Text Interpretation: Sinus rhythm Low voltage, precordial leads Borderline T abnormalities, anterior leads Confirmed by Edwin Dada ([id number]) on 09/07/2022 8:57:50 AM  Radiology No  results found.  Procedures Procedures    Medications Ordered in ED Medications - No data to display  ED Course/ Medical Decision Making/ A&P                                 Medical Decision Making Amount and/or Complexity of Data Reviewed Labs: ordered. Radiology: ordered.  Risk Prescription drug management.   Maria [name] 22 y.o. presented today for menorrhagia with lactation. Working DDx that I considered at this time includes, but not limited to, pituitary adenoma, pituitary tumor, electrolyte abnormalities, hormonal imbalance.  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  Patient's physical exam was unremarkable.  Upon chart review it appears patient was at [facility] and erroneously discharged due to miscommunication and was encouraged to have an emergent MRI to rule out pituitary pathology causing patient's symptoms.  MRI was ordered here and shows possible 5 mm pituitary adenoma that could be contributing to patient's symptoms.  Patient will be given neurosurgery follow-up and encouraged follow-up with her primary care provider as well for further management.  Patient was given return precautions. Patient stable for discharge at this time.  Patient verbalized understanding of plan.         Final Clinical Impression(s) / ED Diagnoses Final diagnoses:  Abnormal menstrual cycle  Lactation symptom  Near syncope     Rx / DC Orders ED Discharge Orders     None         Remi Deter 09/07/22 1855    Charlynne Pander, MD 09/08/22 [phone]

## 2022-09-07 NOTE — ED Provider Notes (Signed)
[place] EMERGENCY DEPARTMENT AT [facility] Provider Note   CSN: [id number] Arrival date & time: 09/07/22  4098     History  Chief Complaint  Patient presents with   Near Syncope    Maria [name] is a 22 y.o. female.  Patient is a 22 year old female presenting for presyncope.  Patient states that he has had a headache over the last 2 days and she was in the shower this morning and leaned forward and felt like she was going to pass out.  She admits to numbness feeling in her tongue, tunnel vision, and numbness and tingling sensation.  States the symptoms resolved after standing up.  Denies any loss of consciousness.  States that this has happened once before several years ago.  Denies any recent falls or head trauma.  Denies blood thinner use.  Denies fevers, chills, or infectious signs or symptoms.  The history is provided by the patient. No language interpreter was used.  Near Syncope Pertinent negatives include no chest pain, no abdominal pain and no shortness of breath.       Home Medications Prior to Admission medications   Medication Sig Start Date End Date Taking? Authorizing Provider  FLUoxetine (PROZAC) 20 MG capsule Take 1 capsule (20 mg total) by mouth daily. 08/07/22   Sarina Ill, DO  risperiDONE (RISPERDAL) 0.5 MG tablet Take 1 tablet (0.5 mg total) by mouth 2 (two) times daily at 8 am and 4 pm. 08/06/22   Sarina Ill, DO  traZODone (DESYREL) 50 MG tablet Take 1 tablet (50 mg total) by mouth at bedtime as needed for sleep. 08/06/22   Sarina Ill, DO      Allergies    Banana    Review of Systems   Review of Systems  Constitutional:  Negative for chills and fever.  HENT:  Negative for ear pain and sore throat.   Eyes:  Negative for pain and visual disturbance.  Respiratory:  Negative for cough and shortness of breath.   Cardiovascular:  Positive for near-syncope. Negative for chest pain and palpitations.   Gastrointestinal:  Negative for abdominal pain and vomiting.  Genitourinary:  Negative for dysuria and hematuria.  Musculoskeletal:  Negative for arthralgias and back pain.  Skin:  Negative for color change and rash.  Neurological:  Negative for seizures and syncope.  All other systems reviewed and are negative.   Physical Exam Updated Vital Signs BP 100/68   Pulse 86   Temp 98.2 F (36.8 C) (Oral)   Resp (!) 22   Wt 61.2 kg   SpO2 100%   BMI 24.69 kg/m  Physical Exam Vitals and nursing note reviewed.  Constitutional:      General: She is not in acute distress.    Appearance: She is well-developed.  HENT:     Head: Normocephalic and atraumatic.  Eyes:     Conjunctiva/sclera: Conjunctivae normal.  Cardiovascular:     Rate and Rhythm: Normal rate and regular rhythm.     Heart sounds: No murmur heard. Pulmonary:     Effort: Pulmonary effort is normal. No respiratory distress.     Breath sounds: Normal breath sounds.  Abdominal:     Palpations: Abdomen is soft.     Tenderness: There is no abdominal tenderness.  Musculoskeletal:        General: No swelling.     Cervical back: Neck supple.  Skin:    General: Skin is warm and dry.     Capillary Refill: Capillary  refill takes less than 2 seconds.  Neurological:     Mental Status: She is alert and oriented to person, place, and time.     GCS: GCS eye subscore is 4. GCS verbal subscore is 5. GCS motor subscore is 6.     Cranial Nerves: Cranial nerves 2-12 are intact.     Sensory: Sensation is intact.     Motor: Motor function is intact.     Coordination: Coordination is intact.  Psychiatric:        Mood and Affect: Mood normal.     ED Results / Procedures / Treatments   Labs (all labs ordered are listed, but only abnormal results are displayed) Labs Reviewed  BASIC METABOLIC PANEL - Abnormal; Notable for the following components:      Result Value   Calcium 8.7 (*)    All other components within normal limits   CBC  URINALYSIS, ROUTINE W REFLEX MICROSCOPIC  PREGNANCY, URINE  CBG MONITORING, ED    EKG EKG Interpretation Date/Time:  Wednesday September 07 2022 08:44:59 EDT Ventricular Rate:  80 PR Interval:  130 QRS Duration:  76 QT Interval:  355 QTC Calculation: 410 R Axis:   77  Text Interpretation: Sinus rhythm Low voltage, precordial leads Borderline T abnormalities, anterior leads Confirmed by Edwin Dada ([id number]) on 09/07/2022 8:57:50 AM  Radiology No results found.  Procedures Procedures    Medications Ordered in ED Medications - No data to display  ED Course/ Medical Decision Making/ A&P                                 Medical Decision Making Amount and/or Complexity of Data Reviewed Labs: ordered.    22 year old female presenting for presyncope.  Patient is alert oriented x 3, no acute distress, afebrile, stable to signs.  ECG demonstrates normal sinus rhythm.  No Wolff-Parkinson-White or delta wave.  No cardiac arrhythmias on monitor.  No infectious signs or symptoms.  No signs or symptoms of sepsis.  Stable hemoglobin.  Urine pregnancy negative.  Went to update patient on her laboratory findings and she was no longer in the room.  Notified by nursing personnel, there was some miscommunication and her nurse thought that she was up for discharge when she was not.  The patient has since left the facility.  No discharge paperwork was provided and no diagnoses were established.  I called the patient spoke with her over the phone at approximately 12:37 PM when she called me back.  I let her know that her EKG was stable, her hemoglobin was stable, her electrolytes were stable, and her serum pregnancy was negative.  We talked about her prolonged menstrual cycles, lactation, and current symptoms with concerns for pituitary gland tumor.  She has an MRI scheduled for 09/14/2022.  I recommended that she return to the emergency department for transfer to [facility] for MRI and a more emergent  basis.  She has declined at this time stating that she is going somewhere with her mom today.  I told her to return either to our emergency department or [facility] for MRI if she changes her mind for any reason or if she has worsening presyncopal symptoms.  Patient understandable and agreeable to terms.   Detailed discussions were had with the patient regarding current findings, and need for close f/u with PCP or on call doctor. The patient has been instructed to return immediately if the symptoms worsen  in any way for re-evaluation. Patient verbalized understanding and is in agreement with current care plan. All questions answered prior to discharge.         Final Clinical Impression(s) / ED Diagnoses Final diagnoses:  Abnormal menstrual cycle  Lactation symptom  Near syncope    Rx / DC Orders ED Discharge Orders     None         Franne Forts, DO 09/07/22 1241

## 2022-09-07 NOTE — Discharge Instructions (Addendum)
Please follow-up with the neurosurgeon I have attached your for you today.  Today your MRI shows a possible 5 mm pituitary adenoma that will need further evaluation with a specialist.  You also need to follow-up with your primary care provider as well to be reevaluated.  If symptoms change or worsen please return to ER.

## 2022-09-07 NOTE — ED Notes (Signed)
Unable to urinate at this time.  

## 2022-09-08 DIAGNOSIS — F4321 Adjustment disorder with depressed mood: Secondary | ICD-10-CM | POA: Diagnosis not present

## 2022-09-12 DIAGNOSIS — F4321 Adjustment disorder with depressed mood: Secondary | ICD-10-CM | POA: Diagnosis not present

## 2022-09-13 DIAGNOSIS — F32A Depression, unspecified: Secondary | ICD-10-CM | POA: Diagnosis not present

## 2022-09-13 DIAGNOSIS — F410 Panic disorder [episodic paroxysmal anxiety] without agoraphobia: Secondary | ICD-10-CM | POA: Diagnosis not present

## 2022-09-13 DIAGNOSIS — R110 Nausea: Secondary | ICD-10-CM | POA: Diagnosis not present

## 2022-09-13 DIAGNOSIS — D352 Benign neoplasm of pituitary gland: Secondary | ICD-10-CM | POA: Diagnosis not present

## 2022-09-14 ENCOUNTER — Other Ambulatory Visit: Payer: BC Managed Care – PPO

## 2022-09-14 DIAGNOSIS — D352 Benign neoplasm of pituitary gland: Secondary | ICD-10-CM | POA: Diagnosis not present

## 2022-09-14 DIAGNOSIS — N926 Irregular menstruation, unspecified: Secondary | ICD-10-CM | POA: Diagnosis not present

## 2022-09-15 DIAGNOSIS — D352 Benign neoplasm of pituitary gland: Secondary | ICD-10-CM | POA: Diagnosis not present

## 2022-09-16 DIAGNOSIS — D352 Benign neoplasm of pituitary gland: Secondary | ICD-10-CM | POA: Diagnosis not present

## 2022-10-05 DIAGNOSIS — D352 Benign neoplasm of pituitary gland: Secondary | ICD-10-CM | POA: Diagnosis not present

## 2022-10-27 NOTE — Plan of Care (Signed)
[facility] Tonsillectomy/Adenoidectomy, Postoperative PEDS care plan entered in error.

## 2022-11-10 DIAGNOSIS — D352 Benign neoplasm of pituitary gland: Secondary | ICD-10-CM | POA: Diagnosis not present

## 2022-11-25 DIAGNOSIS — F339 Major depressive disorder, recurrent, unspecified: Secondary | ICD-10-CM | POA: Diagnosis not present

## 2022-12-09 ENCOUNTER — Ambulatory Visit (INDEPENDENT_AMBULATORY_CARE_PROVIDER_SITE_OTHER): Payer: BC Managed Care – PPO | Admitting: Gastroenterology

## 2022-12-09 ENCOUNTER — Encounter: Payer: Self-pay | Admitting: Gastroenterology

## 2022-12-09 ENCOUNTER — Other Ambulatory Visit (INDEPENDENT_AMBULATORY_CARE_PROVIDER_SITE_OTHER): Payer: BC Managed Care – PPO

## 2022-12-09 VITALS — BP 104/70 | HR 91 | Ht 63.0 in | Wt 144.0 lb

## 2022-12-09 DIAGNOSIS — K625 Hemorrhage of anus and rectum: Secondary | ICD-10-CM

## 2022-12-09 DIAGNOSIS — K580 Irritable bowel syndrome with diarrhea: Secondary | ICD-10-CM

## 2022-12-09 DIAGNOSIS — R197 Diarrhea, unspecified: Secondary | ICD-10-CM

## 2022-12-09 DIAGNOSIS — R110 Nausea: Secondary | ICD-10-CM

## 2022-12-09 LAB — COMPREHENSIVE METABOLIC PANEL
ALT: 9 U/L (ref 0–35)
AST: 16 U/L (ref 0–37)
Albumin: 4.5 g/dL (ref 3.5–5.2)
Alkaline Phosphatase: 89 U/L (ref 39–117)
BUN: 9 mg/dL (ref 6–23)
CO2: 29 meq/L (ref 19–32)
Calcium: 9.2 mg/dL (ref 8.4–10.5)
Chloride: 101 meq/L (ref 96–112)
Creatinine, Ser: 0.74 mg/dL (ref 0.40–1.20)
GFR: 115.39 mL/min (ref >60.00)
Glucose, Bld: 93 mg/dL (ref 70–99)
Potassium: 4.2 meq/L (ref 3.5–5.1)
Sodium: 138 meq/L (ref 135–145)
Total Bilirubin: 0.5 mg/dL (ref 0.2–1.2)
Total Protein: 7.5 g/dL (ref 6.0–8.3)

## 2022-12-09 LAB — CBC WITH DIFFERENTIAL/PLATELET
Basophils Absolute: 0 10*3/uL (ref 0.0–0.1)
Basophils Relative: 0.3 % (ref 0.0–3.0)
Eosinophils Absolute: 0.1 10*3/uL (ref 0.0–0.7)
Eosinophils Relative: 0.9 % (ref 0.0–5.0)
HCT: 44 % (ref 36.0–46.0)
Hemoglobin: 15.2 g/dL — ABNORMAL HIGH (ref 12.0–15.0)
Lymphocytes Relative: 26 % (ref 12.0–46.0)
Lymphs Abs: 3.2 10*3/uL (ref 0.7–4.0)
MCHC: 34.6 g/dL (ref 30.0–36.0)
MCV: 94.2 fL (ref 78.0–100.0)
Monocytes Absolute: 0.8 10*3/uL (ref 0.1–1.0)
Monocytes Relative: 6.3 % (ref 3.0–12.0)
Neutro Abs: 8.1 10*3/uL — ABNORMAL HIGH (ref 1.4–7.7)
Neutrophils Relative %: 66.5 % (ref 43.0–77.0)
Platelets: 447 10*3/uL — ABNORMAL HIGH (ref 150.0–400.0)
RBC: 4.67 Mil/uL (ref 3.87–5.11)
RDW: 13.8 % (ref 11.5–15.5)
WBC: 12.2 10*3/uL — ABNORMAL HIGH (ref 4.0–10.5)

## 2022-12-09 LAB — LIPASE: Lipase: 32 U/L (ref 11.0–59.0)

## 2022-12-09 LAB — C-REACTIVE PROTEIN: CRP: <1 mg/dL (ref 0.5–20.0)

## 2022-12-09 MED ORDER — DICYCLOMINE HCL 10 MG PO CAPS: 10.0000 mg | ORAL_CAPSULE | Freq: Two times a day (BID) | ORAL | 2 refills | Status: DC

## 2022-12-09 NOTE — Patient Instructions (Addendum)
_______________________________________________________  If your blood pressure at your visit was 140/90 or greater, please contact your primary care physician to follow up on this.  _______________________________________________________  If you are age 21 or older, your body mass index should be between 23-30. Your Body mass index is 25.51 kg/m. If this is out of the aforementioned range listed, please consider follow up with your Primary Care Provider.  If you are age 59 or younger, your body mass index should be between 19-25. Your Body mass index is 25.51 kg/m. If this is out of the aformentioned range listed, please consider follow up with your Primary Care Provider.   ________________________________________________________  The [facility] providers would like to encourage you to use [facility] to communicate with providers for non-urgent requests or questions.  Due to long hold times on the telephone, sending your provider a message by [facility] may be a faster and more efficient way to get a response.  Please allow 48 business hours for a response.  Please remember that this is for non-urgent requests.  _______________________________________________________  We have sent the following medications to your pharmacy for you to pick up at your convenience: Bentyl 2 times a day for 1 month  Your provider has requested that you go to the basement level for lab work before leaving today. Press "B" on the elevator. The lab is located at the first door on the left as you exit the elevator.  Your provider has ordered "Diatherix" stool testing for you. You have received a kit from our office today containing all necessary supplies to complete this test. Please carefully read the stool collection instructions provided in the kit before opening the accompanying materials. In addition, be sure to place the label from the top right corner of the laboratory request sheet onto the "puritan opti-swab" tube  that is supplied in the kit. This label should include your full name and date of birth. After completing the test, you should secure the purtian tube into the specimen biohazard bag. The laboratory request information sheet (including date and time of specimen collection) should be placed into the outside pocket of the specimen biohazard bag and returned to the [facility] lab with 2 days of collection.   If the laboratory information sheet specimen date and time are not filled out, the test will NOT be performed.  You have been scheduled for an endoscopy and colonoscopy. Please follow the written instructions given to you at your visit today.  Please pick up your prep supplies at the pharmacy within the next 1-3 days.  If you use inhalers (even only as needed), please bring them with you on the day of your procedure.  DO NOT TAKE 7 DAYS PRIOR TO TEST- Trulicity (dulaglutide) Ozempic, Wegovy (semaglutide) Mounjaro (tirzepatide) Bydureon Bcise (exanatide extended release)  DO NOT TAKE 1 DAY PRIOR TO YOUR TEST Rybelsus (semaglutide) Adlyxin (lixisenatide) Victoza (liraglutide) Byetta (exanatide) ___________________________________________________________________________  Thank you,  Dr. Lynann Bologna

## 2022-12-09 NOTE — Progress Notes (Signed)
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 Past right elbow surgery.  I initially saw [name] in office 11/23/2021 due to having nausea and diarrhea.  Celiac serology was normal.  H. pylori stool antigen was negative.   CRP < 1. CBC showed a WBC count of 9.7.  Hemoglobin 13.7 mildly elevated platelet count 411.  Normal LFTs.  GI pathogen panel was negative.  She was prescribed ondansetron 4mg  ODT every 6-8 hours as needed and Famotidine 20 mg daily.  She presents today for further follow-up.  She took Famotidine for 30 days then stopped it as she continued to have nausea.  Ondansetron was ineffective.  She stated taking a friend's nausea medication which worked better but she cannot recall the name of this medication.  She vomited at 3 AM on 01/07/2022, emesis consisted of partially digested pizza she ate for dinner.  No hematemesis.  No further vomiting since then.  She stated her diarrhea has significantly calm down.  She is passing nonbloody diarrhea x 2 episodes two days weekly and the other days of the week she has passed a normal formed brown stool.  No further abdominal pain.  No NSAID use.  No marijuana use.  She has lost 6 pounds over the past 2 months.   Labs 11/23/2021: CRP < 1. TTG IgA < 1. TTG IGG < 1. IgA 211. H. Pylori stool antigen negative.        Latest Ref Rng & Units 11/23/2021    3:44 PM 05/26/2019    8:34 AM 09/17/2016    6:45 PM  CBC  WBC 4.5 - 10.5 K/uL 9.7  9.6  18.1   Hemoglobin 12.0 - 15.0 g/dL 40.9  81.1  91.4   Hematocrit 36.0 - 46.0 % 40.4  40.9  39.1   Platelets 150.0 - 400.0 K/uL 411.0  394  485         Latest Ref Rng & Units 11/23/2021    3:44 PM 05/26/2019    8:34 AM 09/17/2016    6:45 PM  CMP  Glucose 70 - 99 mg/dL 89  91  782   BUN 6 - 23 mg/dL 7  10  8    Creatinine  0.40 - 1.20 mg/dL 9.56  2.13  0.86   Sodium 135 - 145 mEq/L 137  138  137   Potassium 3.5 - 5.1 mEq/L 3.7  3.5  3.3   Chloride 96 - 112 mEq/L 102  102  105   CO2 19 - 32 mEq/L 28  25  23    Calcium 8.4 - 10.5 mg/dL 9.3  8.9  9.5   Total Protein 6.0 - 8.3 g/dL 7.5  7.7     Total Bilirubin 0.2 - 1.2 mg/dL 0.6  0.8     Alkaline Phos 39 - 117 U/L 85  73     AST 0 - 37 U/L 14  36     ALT 0 - 35 U/L 8  31       Medications Ordered Prior to Encounter        Current Outpatient Medications on File Prior to Visit  Medication Sig Dispense Refill   albuterol (VENTOLIN HFA) 108 (90 Base) MCG/ACT inhaler         ibuprofen (ADVIL,MOTRIN) 200 MG tablet  Take 200 mg by mouth every 6 (six) hours as needed for headache.       ondansetron (ZOFRAN) 4 MG tablet Take 1 tablet (4 mg total) by mouth every 6 (six) hours as needed for nausea or vomiting. Dissolve under tongue every 6-8 hours as needed. 30 tablet 1    No current facility-administered medications on file prior to visit.      Allergies      Allergies  Allergen Reactions   Banana Nausea And Vomiting      Current Medications, Allergies, Past Medical History, Past Surgical History, Family History and Social History were reviewed in [employer] record.   Review of Systems:   Constitutional: Negative for fever, sweats, chills or weight loss.  Respiratory: Negative for shortness of breath.   Cardiovascular: Negative for chest pain, palpitations and leg swelling.  Gastrointestinal: See HPI.  Musculoskeletal: Negative for back pain or muscle aches.  Neurological: Negative for dizziness, headaches or paresthesias.      Physical Exam: Ht 5\' 2"  (1.575 m)   Wt 152 lb (68.9 kg)   BMI 27.80 kg/m     Wt Readings from Last 3 Encounters:  01/10/22 152 lb (68.9 kg)  11/23/21 158 lb (71.7 kg)  08/14/17 142 lb 3.2 oz (64.5 kg) (81 %, Z= 0.89)*    * Growth percentiles are based on CDC (Girls, 2-20 Years) data.    General: 22  year old female in no acute distress. Head: Normocephalic and atraumatic. Eyes: No scleral icterus. Conjunctiva pink . Ears: Normal auditory acuity. Mouth: Dentition intact. No ulcers or lesions.  Lungs: Clear throughout to auscultation. Heart: Regular rate and rhythm, no murmur. Abdomen: Soft, nontender and nondistended. No masses or hepatomegaly. Normal bowel sounds x 4 quadrants.  Rectal: Deferred.  Musculoskeletal: Symmetrical with no gross deformities. Extremities: No edema. Neurological: Alert oriented x 4. No focal deficits.  Psychological: Alert and cooperative. Normal mood and affect   Assessment and Recommendations:   31) 22 year old female with daily nausea, vomited at 3am on 01/07/2022. H. Pylori stool antigen negative.  -Abdominal sonogram to evaluate the gallbladder  -EGD benefits and risks discussed including risk with sedation, risk of bleeding, perforation and infection.  Schedule EGD after abdominal sonogram results reviewed. -Patient will contact me with name of preferred antiemetic -Patient to contact office if symptoms worsen -Avoid fatty/greasy foods   2) Diarrhea, improving.  GI pathogen panel negative.  CRP < 1.0.  -For diagnostic colonoscopy for now her symptoms have improved     Attending physician's note   I have taken history, reviewed the chart and examined the patient. I performed a substantive portion of this encounter, including complete performance of at least one of the key components, in conjunction with the APP. I agree with the Advanced Practitioner's note, impression and recommendations.   Wt Readings from Last 3 Encounters:  12/09/22 144 lb (65.3 kg)  09/07/22 135 lb (61.2 kg)  08/02/22 147 lb (66.7 kg)    Patient accompanied by her mom Has been having continued intermittent nausea, lower abdominal pain, diarrhea with occasional bright red blood per rectum. Sister at age 40-Dx with crohns Mom quite concerned about the symptoms.  Above  notes reviewed.  Had negative ultrasound of abdomen.  Negative HP stool antigen     12/09/2022    2:02 PM 09/07/2022    7:00 PM 09/07/2022    5:00 PM  Vitals with BMI  Height 5\' 3"     Weight 144 lbs  BMI 25.51    Systolic 104 108 95  Diastolic 70 70 60  Pulse 91 73 89     Gen: awake, alert, NAD HEENT: anicteric, no pallor CV: RRR, no mrg Pulm: CTA b/l Abd: soft, NT/ND, +BS throughout.  Rectal exam at the time of colonoscopy. Ext: no c/c/e Neuro: nonfocal    IBS-D Rectal bleeding Nausea  Plan: -CBC, CMP, CRP, lipase -Stool for GI pathogens, calprotectin, -EGD/colon with miralax -Bentyl 10 BID x 48month 60, 2RF -If still with problems, CT AP with contrast   Edman Circle, MD [facility] [phone]

## 2022-12-12 ENCOUNTER — Other Ambulatory Visit: Payer: BC Managed Care – PPO

## 2022-12-12 ENCOUNTER — Telehealth: Payer: Self-pay | Admitting: Gastroenterology

## 2022-12-12 DIAGNOSIS — K625 Hemorrhage of anus and rectum: Secondary | ICD-10-CM | POA: Diagnosis not present

## 2022-12-12 DIAGNOSIS — R110 Nausea: Secondary | ICD-10-CM | POA: Diagnosis not present

## 2022-12-12 DIAGNOSIS — K580 Irritable bowel syndrome with diarrhea: Secondary | ICD-10-CM | POA: Diagnosis not present

## 2022-12-12 NOTE — Telephone Encounter (Signed)
PT is requesting call back to discuss symptoms. She has been having stomach pain and hot flashes all weekend and very concerned. Please advise.

## 2022-12-13 NOTE — Telephone Encounter (Signed)
Pt stated that she had hot flashes over the weekend that in return made her nauseous. Pt was notified that in regard to the hot flashes that she should reach out to her PCP. Pt verbalized understanding with all questions answered.

## 2022-12-14 LAB — CALPROTECTIN, FECAL: Calprotectin, Fecal: 11 ug/g (ref 0–120)

## 2022-12-16 ENCOUNTER — Telehealth: Payer: Self-pay

## 2022-12-16 NOTE — Telephone Encounter (Signed)
Patient made aware that stool came back negative for any infections

## 2022-12-20 ENCOUNTER — Other Ambulatory Visit: Payer: Self-pay

## 2022-12-20 ENCOUNTER — Other Ambulatory Visit (INDEPENDENT_AMBULATORY_CARE_PROVIDER_SITE_OTHER): Payer: BC Managed Care – PPO

## 2022-12-20 DIAGNOSIS — D72829 Elevated white blood cell count, unspecified: Secondary | ICD-10-CM | POA: Diagnosis not present

## 2022-12-20 LAB — URINALYSIS WITH CULTURE, IF INDICATED
Leukocytes,Ua: NEGATIVE
Nitrite: NEGATIVE
Specific Gravity, Urine: 1.025 (ref 1.000–1.030)
Total Protein, Urine: NEGATIVE
Urine Glucose: NEGATIVE
Urobilinogen, UA: 2 — AB (ref 0.0–1.0)
pH: 7 (ref 5.0–8.0)

## 2022-12-20 NOTE — Telephone Encounter (Signed)
Patient will come back tomorrow to do UA. Order wasn't placed

## 2022-12-20 NOTE — Telephone Encounter (Signed)
Patient called to follow up on results for urine test.

## 2022-12-21 ENCOUNTER — Telehealth: Payer: Self-pay | Admitting: Gastroenterology

## 2022-12-21 NOTE — Telephone Encounter (Signed)
Spoke with patient and tell her to reach out to her PCP regarding her AU but she said she has been feeling tired for the past week and wants to know if there are anything else she can do but I told her to mention that to her PCP as well but she asked if there are any other testing and I told her we are just waiting for that date to come for her EGD/Colon.

## 2022-12-21 NOTE — Telephone Encounter (Signed)
Patient called and stated that she received her urine test results back and they were all abnormal. Patient also stated that she would like to go over the results with a nurse. Patient is requesting a call back. Please advise.

## 2022-12-22 ENCOUNTER — Encounter: Payer: Self-pay | Admitting: Gastroenterology

## 2022-12-22 DIAGNOSIS — R5383 Other fatigue: Secondary | ICD-10-CM | POA: Diagnosis not present

## 2022-12-23 NOTE — Telephone Encounter (Signed)
Patient called in to let us know she had an episode of small amount of bright red bleeding this morning after a bowel movement. She is also on her period, but says this was different. Denies any pain or irritation. Rectal bleeding was also noted in her recent OV on 12/09/22, has been happening intermittently over the last few months per pt. Patient is scheduled for an endo in 02/2023 with Dr. Chales Abrahams. She's been advised to monitor bleeding for now, and if it starts to occur more frequently or in larger amounts or has further symptoms then to please let us know. Advised her I would keep her on waitlist & if a procedure date becomes available sooner then I would contact her. Pt verbalized all understanding.

## 2022-12-23 NOTE — Telephone Encounter (Signed)
Patient called wanted to let the office know she has rectal bleeding. Please advise.

## 2022-12-25 ENCOUNTER — Other Ambulatory Visit: Payer: Self-pay

## 2022-12-25 ENCOUNTER — Emergency Department (HOSPITAL_COMMUNITY)
Admission: EM | Admit: 2022-12-25 | Discharge: 2022-12-25 | Disposition: A | Discharge status: Home / Self Care | Payer: BC Managed Care – PPO | Attending: Emergency Medicine | Admitting: Emergency Medicine

## 2022-12-25 ENCOUNTER — Encounter (HOSPITAL_COMMUNITY): Payer: Self-pay

## 2022-12-25 DIAGNOSIS — K625 Hemorrhage of anus and rectum: Secondary | ICD-10-CM | POA: Diagnosis not present

## 2022-12-25 DIAGNOSIS — K921 Melena: Secondary | ICD-10-CM | POA: Diagnosis not present

## 2022-12-25 LAB — URINALYSIS, ROUTINE W REFLEX MICROSCOPIC
Bilirubin Urine: NEGATIVE
Glucose, UA: NEGATIVE mg/dL
Ketones, ur: NEGATIVE mg/dL
Leukocytes,Ua: NEGATIVE
Nitrite: NEGATIVE
Protein, ur: NEGATIVE mg/dL
Specific Gravity, Urine: 1.018 (ref 1.005–1.030)
pH: 7 (ref 5.0–8.0)

## 2022-12-25 LAB — LIPASE, BLOOD: Lipase: 28 U/L (ref 11–51)

## 2022-12-25 LAB — COMPREHENSIVE METABOLIC PANEL
ALT: 11 U/L (ref 0–44)
AST: 18 U/L (ref 15–41)
Albumin: 3.9 g/dL (ref 3.5–5.0)
Alkaline Phosphatase: 70 U/L (ref 38–126)
Anion gap: 8 (ref 5–15)
BUN: 12 mg/dL (ref 6–20)
CO2: 24 mmol/L (ref 22–32)
Calcium: 9.2 mg/dL (ref 8.9–10.3)
Chloride: 104 mmol/L (ref 98–111)
Creatinine, Ser: 0.72 mg/dL (ref 0.44–1.00)
GFR, Estimated: >60 mL/min (ref >60)
Glucose, Bld: 117 mg/dL — ABNORMAL HIGH (ref 70–99)
Potassium: 3.6 mmol/L (ref 3.5–5.1)
Sodium: 136 mmol/L (ref 135–145)
Total Bilirubin: 0.5 mg/dL (ref <1.2)
Total Protein: 7.2 g/dL (ref 6.5–8.1)

## 2022-12-25 LAB — CBC
HCT: 39.5 % (ref 36.0–46.0)
Hemoglobin: 13.6 g/dL (ref 12.0–15.0)
MCH: 31.7 pg (ref 26.0–34.0)
MCHC: 34.4 g/dL (ref 30.0–36.0)
MCV: 92.1 fL (ref 80.0–100.0)
Platelets: 399 10*3/uL (ref 150–400)
RBC: 4.29 MIL/uL (ref 3.87–5.11)
RDW: 12.3 % (ref 11.5–15.5)
WBC: 7.7 10*3/uL (ref 4.0–10.5)
nRBC: 0 % (ref 0.0–0.2)

## 2022-12-25 LAB — HCG, SERUM, QUALITATIVE: Preg, Serum: NEGATIVE

## 2022-12-25 NOTE — ED Triage Notes (Addendum)
Pt arrives via POV. Pt reports abdominal pain and rectal bleeding since Thursday. Pt showed this RN pictures of blood in toilet and on tissue paper, blood is bright red and doesn't appear to be a large amount. Pt states she does have to strain when having a BM. She has a scheduled colonoscopy in Feb. Reports increased fatigue for the past couple of weeks. Pt AxOx4.

## 2022-12-25 NOTE — ED Provider Notes (Signed)
[place] EMERGENCY DEPARTMENT AT [facility] Provider Note   CSN: [id number] Arrival date & time: 12/25/22  1004     History  Chief Complaint  Patient presents with   Abdominal Pain   Rectal Bleeding    Maria [name] is a 22 y.o. female.  HPI    22 year old female comes in with chief complaint of bloody stool.  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  She is following up with endocrinology for that.  She also has had intermittent episodes of bloody stools in the last few months, but over the last 3 days she has been noticing blood every time she wipes herself.  She is supposed to follow-up with GI doctors in January.  Her sister was recently diagnosed with Crohn's disease.  Patient denies any nausea, vomiting, abdominal pain.  Until recently, she suspected that the bleeding was because she was straining.  Patient has brought pictures of bloody wipes.  Home Medications Prior to Admission medications   Medication Sig Start Date End Date Taking? Authorizing Provider  dicyclomine (BENTYL) 10 MG capsule Take 1 capsule (10 mg total) by mouth 2 (two) times daily. For 1 month 12/09/22   Lynann Bologna, MD  FLUoxetine (PROZAC) 20 MG capsule Take 1 capsule (20 mg total) by mouth daily. 08/07/22   Sarina Ill, DO  traZODone (DESYREL) 50 MG tablet Take 1 tablet (50 mg total) by mouth at bedtime as needed for sleep. 08/06/22   Sarina Ill, DO  Vitamin D, Ergocalciferol, (DRISDOL) 1.25 MG (50000 UNIT) CAPS capsule Take 50,000 Units by mouth once a week. 11/14/22   [provider]      Allergies    Banana    Review of Systems   Review of Systems  All other systems reviewed and are negative.   Physical Exam Updated Vital Signs BP 110/81 (BP Location: Right Arm)   Pulse 87   Temp 98 F (36.7 C) (Oral)   Resp 18   Ht 5\' 2"  (1.575 m)   Wt 64.9 kg   SpO2 98%   BMI 26.16 kg/m  Physical Exam Vitals and nursing  note reviewed. Exam conducted with a chaperone present.  Constitutional:      Appearance: She is well-developed.  HENT:     Head: Normocephalic and atraumatic.  Eyes:     Extraocular Movements: Extraocular movements intact.  Cardiovascular:     Rate and Rhythm: Normal rate.  Pulmonary:     Effort: Pulmonary effort is normal.  Abdominal:     General: There is no distension.     Palpations: Abdomen is soft.     Tenderness: There is no abdominal tenderness.     Comments: Patient has no evidence of external hemorrhoids, anal fissures. Digital rectal exam not indicated.  Chaperone was present during the evaluation.  Musculoskeletal:     Cervical back: Normal range of motion and neck supple.  Skin:    General: Skin is dry.  Neurological:     Mental Status: She is alert and oriented to person, place, and time.     ED Results / Procedures / Treatments   Labs (all labs ordered are listed, but only abnormal results are displayed) Labs Reviewed  COMPREHENSIVE METABOLIC PANEL - Abnormal; Notable for the following components:      Result Value   Glucose, Bld 117 (*)    All other components within normal limits  URINALYSIS, ROUTINE W REFLEX MICROSCOPIC - Abnormal; Notable for the  following components:   Hgb urine dipstick SMALL (*)    Bacteria, UA RARE (*)    All other components within normal limits  LIPASE, BLOOD  CBC  HCG, SERUM, QUALITATIVE    EKG None  Radiology No results found.  Procedures Procedures    Medications Ordered in ED Medications - No data to display  ED Course/ Medical Decision Making/ A&P                                 Medical Decision Making Amount and/or Complexity of Data Reviewed Labs: ordered.   22 year old female comes in with chief complaint of bloody stools.  No associated abdominal pain.  Family history of Crohn's disease in her sister, which was recently diagnosed.  Patient also has abnormal TSH and pituitary abnormality that is being  worked up by endocrinologist.  She denies any abdominal pain during my assessment and has no abdominal tenderness during exam.  Differential diagnosis considered for this patient includes anal fissure, external hemorrhoid, internal hemorrhoid, diverticulosis, AV malformation, early presentation of inflammatory bowel disease.  The pituitary pathology that she has, family history of Crohn's disease both does heighten the suspicion of evolving IBD.  Exam is overall reassuring.  Basic labs ordered, patient has normal hemoglobin at this time.  LFTs, renal function is also normal.  No indication for CT scan at this time. And is for patient to follow-up with her endocrinologist and GI doctors and return to the ER if her symptoms worsen.  Return precautions discussed.  Final Clinical Impression(s) / ED Diagnoses Final diagnoses:  Hematochezia    Rx / DC Orders ED Discharge Orders     None         Derwood Kaplan, MD 12/25/22 1401

## 2022-12-25 NOTE — Discharge Instructions (Signed)
You are seen in the emergency room for blood in the stool.  The blood work in the emergency room is overall reassuring.  There is no evidence of any external hemorrhoid, anal fissures.  As discussed, painless bleeding could be because of internal hemorrhoids, ulcers, diverticulosis.  Conditions that cannot be picked up in the emergency room.  You have a GI follow-up coming up, it is prudent that you notify them about these episodes.  Continue to follow your endocrine doctors as well.  Return to the emergency room if you start having worsening bleeding, associated abdominal pain, dizziness, lightheadedness, chest pain, shortness of breath, near fainting or fainting.

## 2023-01-03 ENCOUNTER — Ambulatory Visit (INDEPENDENT_AMBULATORY_CARE_PROVIDER_SITE_OTHER): Payer: BC Managed Care – PPO | Admitting: Gastroenterology

## 2023-01-03 ENCOUNTER — Emergency Department (HOSPITAL_COMMUNITY)
Admission: EM | Admit: 2023-01-03 | Discharge: 2023-01-03 | Disposition: A | Discharge status: Home / Self Care | Payer: BC Managed Care – PPO | Attending: Emergency Medicine | Admitting: Emergency Medicine

## 2023-01-03 ENCOUNTER — Encounter: Payer: Self-pay | Admitting: Gastroenterology

## 2023-01-03 ENCOUNTER — Encounter (HOSPITAL_COMMUNITY): Payer: Self-pay

## 2023-01-03 VITALS — BP 126/80 | HR 117 | Ht 62.0 in | Wt 141.8 lb

## 2023-01-03 DIAGNOSIS — R55 Syncope and collapse: Secondary | ICD-10-CM | POA: Diagnosis not present

## 2023-01-03 DIAGNOSIS — N926 Irregular menstruation, unspecified: Secondary | ICD-10-CM | POA: Diagnosis not present

## 2023-01-03 DIAGNOSIS — D72829 Elevated white blood cell count, unspecified: Secondary | ICD-10-CM | POA: Insufficient documentation

## 2023-01-03 DIAGNOSIS — Z8379 Family history of other diseases of the digestive system: Secondary | ICD-10-CM

## 2023-01-03 DIAGNOSIS — R112 Nausea with vomiting, unspecified: Secondary | ICD-10-CM | POA: Diagnosis not present

## 2023-01-03 DIAGNOSIS — I471 Supraventricular tachycardia, unspecified: Secondary | ICD-10-CM

## 2023-01-03 DIAGNOSIS — R110 Nausea: Secondary | ICD-10-CM

## 2023-01-03 DIAGNOSIS — R5383 Other fatigue: Secondary | ICD-10-CM

## 2023-01-03 DIAGNOSIS — Z8719 Personal history of other diseases of the digestive system: Secondary | ICD-10-CM

## 2023-01-03 DIAGNOSIS — K625 Hemorrhage of anus and rectum: Secondary | ICD-10-CM

## 2023-01-03 DIAGNOSIS — R634 Abnormal weight loss: Secondary | ICD-10-CM

## 2023-01-03 DIAGNOSIS — K580 Irritable bowel syndrome with diarrhea: Secondary | ICD-10-CM | POA: Diagnosis not present

## 2023-01-03 DIAGNOSIS — D352 Benign neoplasm of pituitary gland: Secondary | ICD-10-CM

## 2023-01-03 DIAGNOSIS — R000 Tachycardia, unspecified: Secondary | ICD-10-CM

## 2023-01-03 LAB — HEPATIC FUNCTION PANEL
ALT: 12 U/L (ref 0–44)
AST: 20 U/L (ref 15–41)
Albumin: 4.1 g/dL (ref 3.5–5.0)
Alkaline Phosphatase: 75 U/L (ref 38–126)
Bilirubin, Direct: 0.1 mg/dL (ref 0.0–0.2)
Indirect Bilirubin: 0.3 mg/dL (ref 0.3–0.9)
Total Bilirubin: 0.4 mg/dL (ref 0.0–1.2)
Total Protein: 7.6 g/dL (ref 6.5–8.1)

## 2023-01-03 LAB — URINALYSIS, ROUTINE W REFLEX MICROSCOPIC
Bacteria, UA: NONE SEEN
Bilirubin Urine: NEGATIVE
Glucose, UA: NEGATIVE mg/dL
Hgb urine dipstick: NEGATIVE
Ketones, ur: NEGATIVE mg/dL
Leukocytes,Ua: NEGATIVE
Nitrite: NEGATIVE
Protein, ur: 30 mg/dL — AB
Specific Gravity, Urine: 1.025 (ref 1.005–1.030)
pH: 6 (ref 5.0–8.0)

## 2023-01-03 LAB — CBG MONITORING, ED: Glucose-Capillary: 109 mg/dL — ABNORMAL HIGH (ref 70–99)

## 2023-01-03 LAB — BASIC METABOLIC PANEL
Anion gap: 6 (ref 5–15)
BUN: 9 mg/dL (ref 6–20)
CO2: 28 mmol/L (ref 22–32)
Calcium: 9.1 mg/dL (ref 8.9–10.3)
Chloride: 101 mmol/L (ref 98–111)
Creatinine, Ser: 0.72 mg/dL (ref 0.44–1.00)
GFR, Estimated: >60 mL/min (ref >60)
Glucose, Bld: 99 mg/dL (ref 70–99)
Potassium: 3.3 mmol/L — ABNORMAL LOW (ref 3.5–5.1)
Sodium: 135 mmol/L (ref 135–145)

## 2023-01-03 LAB — CBC
HCT: 40.5 % (ref 36.0–46.0)
Hemoglobin: 14 g/dL (ref 12.0–15.0)
MCH: 31.8 pg (ref 26.0–34.0)
MCHC: 34.6 g/dL (ref 30.0–36.0)
MCV: 92 fL (ref 80.0–100.0)
Platelets: 404 10*3/uL — ABNORMAL HIGH (ref 150–400)
RBC: 4.4 MIL/uL (ref 3.87–5.11)
RDW: 12.5 % (ref 11.5–15.5)
WBC: 11.3 10*3/uL — ABNORMAL HIGH (ref 4.0–10.5)
nRBC: 0 % (ref 0.0–0.2)

## 2023-01-03 LAB — LIPASE, BLOOD: Lipase: 28 U/L (ref 11–51)

## 2023-01-03 LAB — HCG, SERUM, QUALITATIVE: Preg, Serum: NEGATIVE

## 2023-01-03 MED ORDER — SODIUM CHLORIDE 0.9 % IV BOLUS
1000.0000 mL | Freq: Once | INTRAVENOUS | Status: AC
Start: 2023-01-03 — End: 2023-01-03
  Administered 2023-01-03: 1000 mL via INTRAVENOUS

## 2023-01-03 MED ORDER — HYDROCORTISONE ACETATE 25 MG RE SUPP: 25.0000 mg | Freq: Two times a day (BID) | RECTAL | 0 refills | Status: DC

## 2023-01-03 MED ORDER — NA SULFATE-K SULFATE-MG SULF 17.5-3.13-1.6 GM/177ML PO SOLN
1.0000 | ORAL | 0 refills | Status: DC
Start: 2023-01-03 — End: 2023-03-03

## 2023-01-03 NOTE — Patient Instructions (Signed)
 You have been scheduled for an endoscopy and colonoscopy. Please follow the written instructions given to you at your visit today.  Please pick up your prep supplies at the pharmacy within the next 1-3 days.  If you use inhalers (even only as needed), please bring them with you on the day of your procedure. ___________________________________________________________________________  You have been scheduled for a CT scan of the abdomen and pelvis at [facility], 1st floor Radiology. You are scheduled on 01/13/23 at 12 pm. You should arrive by 10:45. Make sure that you have no solid foods 4 hours prior to your appointment time. Liquids are acceptable.   The purpose of you drinking the oral contrast is to aid in the visualization of your intestinal tract. The contrast solution may cause some diarrhea. Depending on your individual set of symptoms, you may also receive an intravenous injection of x-ray contrast/dye. Plan on being at [facility] for 45 minutes or longer, depending on the type of exam you are having performed.   If you have any questions regarding your exam or if you need to reschedule, you may call [facility] at [phone] between the hours of 8:00 am and 5:00 pm, Monday-Friday.   Thank you for trusting me with your gastrointestinal care!   Nestor Blower, PA

## 2023-01-03 NOTE — ED Provider Notes (Signed)
 [place] EMERGENCY DEPARTMENT AT [facility] Provider Note   CSN: [id number] Arrival date & time: 01/03/23  1009     History  Chief Complaint  Patient presents with   Near Syncope    Maria [name] is a 21 y.o. female with past medical history significant for headache, SVT, pituitary adenoma presents to the ED complaining of a syncopal episode while she was at an MD office across the street.  Patient states that she was leaving the office.  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  Patient reports that she did not fall him was assisted to the floor.  She had a prodrome of feeling nauseous and hot.  She has also had some bright red rectal bleeding which is why she was at the GI doctor today.  Denies chest pain, shortness of breath, abdominal pain, vomiting, fever.       Home Medications Prior to Admission medications   Medication Sig Start Date End Date Taking? Authorizing Provider  dicyclomine  (BENTYL ) 10 MG capsule Take 1 capsule (10 mg total) by mouth 2 (two) times daily. For 1 month 12/09/22   Charlanne Groom, MD  FLUoxetine  (PROZAC ) 20 MG capsule Take 1 capsule (20 mg total) by mouth daily. 08/07/22   Cam Charlie Loving, DO  hydrocortisone  (ANUSOL -HC) 25 MG suppository Place 1 suppository (25 mg total) rectally 2 (two) times daily for 14 days. 01/03/23 01/17/23  McMichael, Bayley M, PA-C  Na Sulfate-K Sulfate-Mg Sulf 17.5-3.13-1.6 GM/177ML SOLN Take 1 kit by mouth as directed. 01/03/23   McMichael, Nestor HERO, PA-C  traZODone  (DESYREL ) 50 MG tablet Take 1 tablet (50 mg total) by mouth at bedtime as needed for sleep. 08/06/22   Cam Charlie Loving, DO  Vitamin D, Ergocalciferol, (DRISDOL) 1.25 MG (50000 UNIT) CAPS capsule Take 50,000 Units by mouth once a week. 11/14/22   [provider]      Allergies    Banana    Review of Systems   Review of Systems  Respiratory:   Negative for shortness of breath.   Cardiovascular:  Negative for chest pain.  Gastrointestinal:  Positive for anal bleeding and nausea. Negative for abdominal pain and vomiting.  Neurological:  Positive for syncope.    Physical Exam Updated Vital Signs BP 125/87 (BP Location: Right Arm)   Pulse (!) 102   Temp 98.2 F (36.8 C) (Oral)   Resp 18   Ht 5' 2 (1.575 m)   Wt 64 kg   LMP 01/02/2023 (Approximate)   SpO2 99%   BMI 25.79 kg/m  Physical Exam Vitals and nursing note reviewed.  Constitutional:      General: She is not in acute distress.    Appearance: Normal appearance. She is not ill-appearing or diaphoretic.  Cardiovascular:     Rate and Rhythm: Normal rate and regular rhythm.  Pulmonary:     Effort: Pulmonary effort is normal.  Skin:    General: Skin is warm and dry.     Capillary Refill: Capillary refill takes less than 2 seconds.  Neurological:     Mental Status: She is alert and oriented to person, place, and time. Mental status is at baseline.     GCS: GCS eye subscore is 4. GCS verbal subscore is 5. GCS motor subscore is 6.     Sensory: Sensation is intact.     Motor: Motor function is intact.     Coordination: Coordination is intact.  Gait: Gait is intact.  Psychiatric:        Mood and Affect: Mood normal.        Behavior: Behavior normal.     ED Results / Procedures / Treatments   Labs (all labs ordered are listed, but only abnormal results are displayed) Labs Reviewed  BASIC METABOLIC PANEL - Abnormal; Notable for the following components:      Result Value   Potassium 3.3 (*)    All other components within normal limits  CBC - Abnormal; Notable for the following components:   WBC 11.3 (*)    Platelets 404 (*)    All other components within normal limits  URINALYSIS, ROUTINE W REFLEX MICROSCOPIC - Abnormal; Notable for the following components:   APPearance HAZY (*)    Protein, ur 30 (*)    All other components within normal limits  CBG  MONITORING, ED - Abnormal; Notable for the following components:   Glucose-Capillary 109 (*)    All other components within normal limits  HCG, SERUM, QUALITATIVE  HEPATIC FUNCTION PANEL  LIPASE, BLOOD    EKG None  Radiology No results found.  Procedures Procedures    Medications Ordered in ED Medications  sodium chloride  0.9 % bolus 1,000 mL (0 mLs Intravenous Stopped 01/03/23 1305)    ED Course/ Medical Decision Making/ A&P                                 Medical Decision Making Amount and/or Complexity of Data Reviewed Labs: ordered.   This patient presents to the ED with chief complaint(s) of syncope and collapse with pertinent past medical history of pituitary adenoma.  The complaint involves an extensive differential diagnosis and also carries with it a high risk of complications and morbidity.    The differential diagnosis includes syncope, cardiac dysrhythmia, metabolic derangement  The initial plan is to labs were obtained during triage process and patient was given a fluid bolus  Additional history obtained: Records reviewed  [facility] note from today  Initial Assessment:   Exam significant for overall well-appearing patient who is not in acute distress.  Vital signs are stable and she is currently afebrile.  She is moving all extremities appropriately.  She is alert and oriented answering all questions appropriately.  Patient walks with a steady gait and does not have any lightheadedness upon standing.  Skin is warm and dry.  Heart rate is normal in the 90s with regular rhythm.  Independent ECG/labs interpretation:  The following labs were independently interpreted:  CBC with mild leukocytosis, no anemia.  Metabolic panel with mild hypokalemia, no electrolyte disturbance.  Renal function is normal. UA without evidence of infection, mild proteinuria. Hepatic function panel unremarkable.  Lipase unremarkable.  Treatment and Reassessment: Patient was given  1L NS bolus.  Patient was able to ambulate to the bathroom without difficulty.  She does not report any lightheadedness.  She has not had any further syncopal episodes.  Patient reports feeling well.  At this time I feel the patient is appropriate for discharge.  Disposition:   Patient's workup is overall reassuring she does have a history of syncopal episodes related to a pituitary adenoma.  She is feeling improved after fluids and has not had any further syncopal episodes.  The patient has been appropriately medically screened and/or stabilized in the ED. I have low suspicion for any other emergent medical condition which would require further  screening, evaluation or treatment in the ED or require inpatient management. At time of discharge the patient is hemodynamically stable and in no acute distress. I have discussed work-up results and diagnosis with patient and answered all questions. Patient is agreeable with discharge plan. We discussed strict return precautions for returning to the emergency department and they verbalized understanding.            Final Clinical Impression(s) / ED Diagnoses Final diagnoses:  Syncope and collapse    Rx / DC Orders ED Discharge Orders     None         Gretta Gerard SAUNDERS, PA-C 01/03/23 1327    Patsey Lot, MD 01/03/23 [phone]

## 2023-01-03 NOTE — ED Triage Notes (Signed)
 Pt presents via EMS from an MD office across the street. Pt reports a syncopal episode while she was leaving the office. Pt recently diagnosed with a tumor on her pituitary gland in August and so the syncope has been an issue for 5 months since the diagnosis. Pt did not fall, was assisted to the floor. Pt is aware of when she is going to pass out and is able to alert staff. Pt has also had some bright red rectal bleeding which is why she was at the GI office today.

## 2023-01-03 NOTE — Progress Notes (Signed)
 Chief Complaint: rectal bleeding Primary GI MD: Dr. Charlanne  HPI: 22 year old female history of pituitary adenoma, SVT s/p ablation around age 12 and 37, presents for evaluation of a constellation of symptoms.  Last seen 12/09/2022 by Dr. Charlanne At that time she reported daily nausea, vomiting, diarrhea, and rectal bleeding.  Patient was scheduled for EGD/colonoscopy February 28th 2025  Labs 11/23/2021: CRP < 1. TTG IgA < 1. TTG IGG < 1. IgA 211. H. Pylori stool antigen negative.   US  abdomen RUQ 01/13/2022: Normal gallbladder, normal liver  Labs 12/09/2022 -CBC with leukocytosis WBC 12.2, negative [facility] GI pathogen panel -Fecal calprotectin 11 -CMP, CRP, lipase normal  ------TODAY-----  Mom and girlfriend present with patient today.  Patient was seen 12/25/22 at the ED for rectal bleeding.  Workup showed labs normal with no anemia.  No imaging performed.  Rectal exam per EDP showed no evidence of external hemorrhoids or fissures.  No digital rectal exam performed.    Patient states she has had rectal bleeding daily for the past 2 weeks.  Upon reviewing pictures appears she has minimal scant bright red bleeding on tissue paper with about a quarter size spot of red blood in the toilet.  She states this occurs with every bowel movement.  Mom expresses frustration as she feels the EGD/colonoscopy are too far out for the symptoms patient is having.  She is frustrated as she has another daughter who is dealing with Crohn's and has been admitted to the hospital multiple times recently.    Patient is having abdominal pain with eating, weight loss of 10 pounds over the past few months, irregular menstrual cycles (3 periods per month), severe fatigue.  They are concerned as she was recently diagnosed with a pituitary adenoma by her endocrinologist recommend workup from GI as it is not related.  She does not see GYN.  Patient is no longer having diarrhea she is having soft formed stools without  straining.  Past Medical History:  Diagnosis Date   Headache    Pituitary adenoma (HCC)    SVT (supraventricular tachycardia) (HCC)    Tachycardia     Past Surgical History:  Procedure Laterality Date   ABLATION     ELBOW SURGERY Right     No current facility-administered medications for this visit.   Current Outpatient Medications  Medication Sig Dispense Refill   dicyclomine  (BENTYL ) 10 MG capsule Take 1 capsule (10 mg total) by mouth 2 (two) times daily. For 1 month 60 capsule 2   FLUoxetine  (PROZAC ) 20 MG capsule Take 1 capsule (20 mg total) by mouth daily. 30 capsule 3   hydrocortisone  (ANUSOL -HC) 25 MG suppository Place 1 suppository (25 mg total) rectally 2 (two) times daily for 14 days. 28 suppository 0   Na Sulfate-K Sulfate-Mg Sulf 17.5-3.13-1.6 GM/177ML SOLN Take 1 kit by mouth as directed. 354 mL 0   traZODone  (DESYREL ) 50 MG tablet Take 1 tablet (50 mg total) by mouth at bedtime as needed for sleep. 30 tablet 3   Vitamin D, Ergocalciferol, (DRISDOL) 1.25 MG (50000 UNIT) CAPS capsule Take 50,000 Units by mouth once a week.     Facility-Administered Medications Ordered in Other Visits  Medication Dose Route Frequency Provider Last Rate Last Admin   sodium chloride  0.9 % bolus 1,000 mL  1,000 mL Intravenous Once Emil Share, DO        Allergies as of 01/03/2023 - Review Complete 01/03/2023  Allergen Reaction Noted   Banana Nausea And Vomiting 03/31/2016  Family History  Problem Relation Age of Onset   Headache Father    Crohn's disease Sister    Depression Neg Hx    Anxiety disorder Neg Hx    Bipolar disorder Neg Hx    Schizophrenia Neg Hx    ADD / ADHD Neg Hx    Autism Neg Hx     Social History   Socioeconomic History   Marital status: Single    Spouse name: Not on file   Number of children: Not on file   Years of education: Not on file   Highest education level: Not on file  Occupational History   Not on file  Tobacco Use   Smoking status:  Never   Smokeless tobacco: Never  Vaping Use   Vaping status: Never Used  Substance and Sexual Activity   Alcohol use: No    Comment: social   Drug use: No   Sexual activity: Never  Other Topics Concern   Not on file  Social History Narrative   ** Merged History Encounter **       Nishat is in the 11th grade at [employer]. she does well in school. She lives with her parents, grandparents, and sister.       She plays volleyball.    Social Drivers of Corporate Investment Banker Strain: Not on file  Food Insecurity: No Food Insecurity (08/03/2022)   Hunger Vital Sign    Worried About Running Out of Food in the Last Year: Never true    Ran Out of Food in the Last Year: Never true  Transportation Needs: No Transportation Needs (08/03/2022)   PRAPARE - Administrator, Civil Service (Medical): No    Lack of Transportation (Non-Medical): No  Physical Activity: Not on file  Stress: Not on file  Social Connections: Not on file  Intimate Partner Violence: Not At Risk (08/03/2022)   Humiliation, Afraid, Rape, and Kick questionnaire    Fear of Current or Ex-Partner: No    Emotionally Abused: No    Physically Abused: No    Sexually Abused: No    Review of Systems:    Constitutional: No weight loss, fever, chills, weakness or fatigue HEENT: Eyes: No change in vision               Ears, Nose, Throat:  No change in hearing or congestion Skin: No rash or itching Cardiovascular: No chest pain, chest pressure or palpitations   Respiratory: No SOB or cough Gastrointestinal: See HPI and otherwise negative Genitourinary: No dysuria or change in urinary frequency Neurological: No headache, dizziness or syncope Musculoskeletal: No new muscle or joint pain Hematologic: No bleeding or bruising Psychiatric: No history of depression or anxiety    Physical Exam:  Vital signs: BP 126/80   Pulse (!) 117   Ht 5' 2 (1.575 m)   Wt 141 lb 12.8 oz (64.3 kg)   LMP 01/02/2023  (Approximate)   BMI 25.94 kg/m   Constitutional: NAD, Well developed, Well nourished, alert and cooperative Head:  Normocephalic and atraumatic. Eyes:   PEERL, EOMI. No icterus. Conjunctiva pink. Respiratory: Respirations even and unlabored. Lungs clear to auscultation bilaterally.   No wheezes, crackles, or rhonchi.  Cardiovascular:  Regular rate and rhythm. No peripheral edema, cyanosis or pallor.  Gastrointestinal:  Soft, nondistended, nontender. No rebound or guarding. Normal bowel sounds. No appreciable masses or hepatomegaly. Rectal:  Not performed. Deferred to colonoscopy per patient Msk:  Symmetrical without gross deformities. Without  edema, no deformity or joint abnormality.  Neurologic:  Alert and  oriented x4;  grossly normal neurologically.  Skin:   Dry and intact without significant lesions or rashes. Psychiatric: Oriented to person, place and time. Demonstrates good judgement and reason without abnormal affect or behaviors.   RELEVANT LABS AND IMAGING: CBC    Component Value Date/Time   WBC 11.3 (H) 01/03/2023 1042   RBC 4.40 01/03/2023 1042   HGB 14.0 01/03/2023 1042   HCT 40.5 01/03/2023 1042   PLT 404 (H) 01/03/2023 1042   MCV 92.0 01/03/2023 1042   MCH 31.8 01/03/2023 1042   MCHC 34.6 01/03/2023 1042   RDW 12.5 01/03/2023 1042   LYMPHSABS 3.2 12/09/2022 1525   MONOABS 0.8 12/09/2022 1525   EOSABS 0.1 12/09/2022 1525   BASOSABS 0.0 12/09/2022 1525    CMP     Component Value Date/Time   NA 135 01/03/2023 1042   K 3.3 (L) 01/03/2023 1042   CL 101 01/03/2023 1042   CO2 28 01/03/2023 1042   GLUCOSE 99 01/03/2023 1042   BUN 9 01/03/2023 1042   CREATININE 0.72 01/03/2023 1042   CALCIUM 9.1 01/03/2023 1042   PROT 7.2 12/25/2022 1024   ALBUMIN 3.9 12/25/2022 1024   AST 18 12/25/2022 1024   ALT 11 12/25/2022 1024   ALKPHOS 70 12/25/2022 1024   BILITOT 0.5 12/25/2022 1024   GFRNONAA >60 01/03/2023 1042   GFRAA >60 05/26/2019 0834     Assessment/Plan:    Rectal bleeding Acute on chronic rectal bleeding ongoing for the past 2 weeks.  Scant bright red blood on tissue paper and small amount in toilet.  Deferred rectal exam to colonoscopy.  No diarrhea/constipation.  No anemia.  Suspect rectal bleeding is likely hemorrhoidal.  Reassuringly patient had a normal fecal calprotectin.  Though she does have a family history of Crohn's disease in her sister. - Colonoscopy for further evaluation.  Will move up EGD/colonoscopy per patient's mother's request with soonest available provider. Okay per Dr. Charlanne. - Hydrocortisone  suppositories twice daily for 14 days  IBS-D History of IBS-D with continued abdominal pain but now having normal stools.  She is scheduled for colonoscopy.  Negative fecal calprotectin and [facility].  Normal abdominal ultrasound. - Move up colonoscopy to soonest available - If colonoscopy is still too far for patient's mother we can consider CT enterography abdomen pelvis with contrast for further evaluation - Currently having normal stools is reassuring - If persistent symptoms and workup comes back as negative, could consider amitriptyline   Nausea and vomiting Nausea still is quite persistent but no episodes of vomiting.  No improvement on antacids.  Normal abdominal ultrasound. - Move up EGD/colonoscopy to soonest available  Pituitary adenoma Menstrual irregularities Severe fatigue Following with endocrinologist.  She does not have a GYN.  No evidence of anemia.  Suspect the symptoms are multifactorial and need further workup. - Continue to follow with endocrinologist - Recommend referral to GYN for menstrual irregularities - GI workup thus far has been unrevealing  Nestor Mollie DEVONNA [facility] 01/03/2023, 11:14 AM  Cc: Verdia Lombard, MD    ADDENDUM After my visit with Ms. Turi she had an episode of syncope outside of our elevators when she was leaving our office.  She stated that she  started to feel faint and sat down on the floor.  After this she had an episode of syncope witnessed by her mother.  She did not hit her head and did not fall.  She may have  been out for less than 2 minutes.  Pulse ox was administered showing a heart rate of 134 with 99% oxygenation. She reported hot flash as well.  I palpated a systolic blood pressure of 98.  She states these were similar symptoms to when she had her history of SVT.  She has not been back to see her cardiologist.  Discussed with patient and mother with this episode of syncope associated with tachycardia she needs further workup by cardiologist prior to proceeding with colonoscopy/endoscopy.  911 was called and patient was transferred to emergency department for further workup.  We will await CT scan.

## 2023-01-03 NOTE — Discharge Instructions (Signed)
 Thank you for allowing us  to be a part of your care today.  You were evaluated in the ED for a syncopal episode that occurred earlier today.  Your workup is overall reassuring.  Be sure to stay well-hydrated and rest.  Return to the ED if you develop sudden worsening of your symptoms or if you have new concerns.

## 2023-01-03 NOTE — ED Provider Triage Note (Signed)
 Emergency Medicine Provider Triage Evaluation Note  Maria [name] , a 22 y.o. female  was evaluated in triage.  Pt complains of syncopal events.  Had 1 at her GI clinic and then 1 when she arrived here.  Both times before she passed out she feels quite warm.  Feels like she has been doing well otherwise.  Mom is concerned and has a a lot of different things that she is worried about.  Currently looking for a second endocrinologist and has plans to try to get an endoscopy and colonoscopy moved up..  Review of Systems  Positive: syncope Negative: Abdominal pain, chest pain, headache  Physical Exam  BP 125/87 (BP Location: Right Arm)   Pulse (!) 102   Temp 98.2 F (36.8 C) (Oral)   Resp 18   Ht 5' 2 (1.575 m)   Wt 64 kg   LMP 01/02/2023 (Approximate)   SpO2 99%   BMI 25.79 kg/m  Gen:   Awake, no distress   Resp:  Normal effort  MSK:   Moves extremities without difficulty  Other:  Benign abdominal exam. No murmurs  Medical Decision Making  Medically screening exam initiated at 11:13 AM.  Appropriate orders placed.  Maria [name] was informed that the remainder of the evaluation will be completed by another provider, this initial triage assessment does not replace that evaluation, and the importance of remaining in the ED until their evaluation is complete.  22 year old female with recurrent syncopal events.  Sounds vasovagal by history.  She does have a history of SVT reportedly had heart rates in the 140s at the GI clinic.  Laboratory evaluation bag IV fluids cardiac monitoring.   Emil Share, DO 01/03/23 1114

## 2023-01-05 ENCOUNTER — Telehealth: Payer: Self-pay | Admitting: Gastroenterology

## 2023-01-05 MED ORDER — HYDROCORTISONE ACETATE 25 MG RE SUPP: 25.0000 mg | Freq: Two times a day (BID) | RECTAL | 0 refills | Status: AC

## 2023-01-05 NOTE — Telephone Encounter (Signed)
 Rx sent to pharmacy

## 2023-01-05 NOTE — Telephone Encounter (Signed)
 Patient f/u on suppository prescription. Advised cma will f/u with her. Patient advised understanding.

## 2023-01-09 DIAGNOSIS — F339 Major depressive disorder, recurrent, unspecified: Secondary | ICD-10-CM | POA: Diagnosis not present

## 2023-01-09 DIAGNOSIS — R110 Nausea: Secondary | ICD-10-CM | POA: Diagnosis not present

## 2023-01-09 NOTE — Progress Notes (Signed)
 Agree with assessment/plan.  Patient had syncopal episode while leaving GI office.  ED notes reviewed.  Nl Hb. Bayley, please make sure that she has cardiology clearance prior to endo procedures as per your last note Agreed to proceed with EGD/colon-first available thereafter.   Anselm Bring, MD [facility] [phone]

## 2023-01-10 ENCOUNTER — Telehealth: Payer: Self-pay | Admitting: *Deleted

## 2023-01-10 NOTE — Telephone Encounter (Deleted)
-----   Message from Nestor CHRISTELLA Blower sent at 01/10/2023  8:36 AM EST ----- Patient was evaluated in ED. Needs to see cardiologist prior to procedure. I think we will have to cancel her procedure for now until she sees a cardiologist and gets clearance. Please inform mom and patient. Can cancel her appt 1/28 unless she can get cardiac clearance prior to the procedure ----- Message ----- From: Charlanne Groom, MD Sent: 01/09/2023  10:33 AM EST To: Nestor CHRISTELLA Blower, PA-C     ----- Message ----- From: Blower Nestor CHRISTELLA, PA-C Sent: 01/03/2023  11:14 AM EST To: Groom Charlanne, MD

## 2023-01-10 NOTE — Addendum Note (Signed)
 Addended by: Richardson Chiquito on: 01/10/2023 10:30 AM   Modules accepted: Orders

## 2023-01-10 NOTE — Telephone Encounter (Signed)
-----   Message from Nestor CHRISTELLA Blower sent at 01/10/2023  8:36 AM EST ----- Patient was evaluated in ED. Needs to see cardiologist prior to procedure. I think we will have to cancel her procedure for now until she sees a cardiologist and gets clearance. Please inform mom and patient. Can cancel her appt 1/28 unless she can get cardiac clearance prior to the procedure ----- Message ----- From: Charlanne Groom, MD Sent: 01/09/2023  10:33 AM EST To: Nestor CHRISTELLA Blower, PA-C     ----- Message ----- From: Blower Nestor CHRISTELLA, PA-C Sent: 01/03/2023  11:14 AM EST To: Groom Charlanne, MD

## 2023-01-10 NOTE — Telephone Encounter (Signed)
 I have spoken to patient to advise that due to recent episodes of syncope with tachycardia and her previous history of SVT, we need cardiac clearance in order to move forward with scheduled procedures. Patient has not yet scheduled an appointment with cardiology. Per Nestor Blower, PA-C, we can refer patient. Referral has been placed. If patient is unable to be seen by cardiology prior to 01/31/23, we will need to rearrange her procedure dates.

## 2023-01-11 DIAGNOSIS — Z6826 Body mass index (BMI) 26.0-26.9, adult: Secondary | ICD-10-CM | POA: Diagnosis not present

## 2023-01-11 DIAGNOSIS — D352 Benign neoplasm of pituitary gland: Secondary | ICD-10-CM | POA: Diagnosis not present

## 2023-01-13 ENCOUNTER — Encounter (HOSPITAL_COMMUNITY): Payer: Self-pay

## 2023-01-13 ENCOUNTER — Ambulatory Visit (HOSPITAL_COMMUNITY)
Admission: RE | Admit: 2023-01-13 | Discharge: 2023-01-13 | Disposition: A | Discharge status: Home / Self Care | Payer: BC Managed Care – PPO | Source: Ambulatory Visit | Attending: Gastroenterology | Admitting: Gastroenterology

## 2023-01-13 DIAGNOSIS — E559 Vitamin D deficiency, unspecified: Secondary | ICD-10-CM | POA: Diagnosis not present

## 2023-01-13 DIAGNOSIS — K760 Fatty (change of) liver, not elsewhere classified: Secondary | ICD-10-CM | POA: Diagnosis not present

## 2023-01-13 DIAGNOSIS — R109 Unspecified abdominal pain: Secondary | ICD-10-CM | POA: Diagnosis not present

## 2023-01-13 DIAGNOSIS — D352 Benign neoplasm of pituitary gland: Secondary | ICD-10-CM | POA: Diagnosis not present

## 2023-01-13 DIAGNOSIS — R112 Nausea with vomiting, unspecified: Secondary | ICD-10-CM | POA: Insufficient documentation

## 2023-01-13 DIAGNOSIS — R634 Abnormal weight loss: Secondary | ICD-10-CM | POA: Insufficient documentation

## 2023-01-13 MED ORDER — IOHEXOL 300 MG/ML  SOLN
100.0000 mL | Freq: Once | INTRAMUSCULAR | Status: AC | PRN
  Administered 2023-01-13: 100 mL via INTRAVENOUS

## 2023-01-18 ENCOUNTER — Telehealth: Payer: Self-pay | Admitting: Gastroenterology

## 2023-01-18 NOTE — Telephone Encounter (Signed)
 Inbound call from patient requesting a call from nurse to discuss CT results. Patient states she is not getting any better. Please advise.

## 2023-01-18 NOTE — Telephone Encounter (Signed)
 Left voicemail for patient advising that we have not yet received results from recent CT enterography completed on 01/13/23. Advised we have been seeing around a 2 week turn around time for outpatient imaging readings. Advised that as soon as we do get results, we will certainly be in touch with results and recommendations.

## 2023-01-23 NOTE — Telephone Encounter (Signed)
Spoke to mother regarding CT results. She verbalizes understanding.

## 2023-01-23 NOTE — Telephone Encounter (Signed)
Patient mom requesting a call back to discuss results. Please advise.

## 2023-01-23 NOTE — Telephone Encounter (Signed)
Unfortunately, patient appointment with cardiology is not until 02/07/23. Therefore, I have spoken to patient and rescheduled her procedures tentatively to 02/23/23. Advised that we will continue to monitor her chart for cardiac clearance prior to moving forward with endoscopic procedures. Patient verbalizes understanding.

## 2023-01-26 ENCOUNTER — Telehealth: Payer: Self-pay | Admitting: Gastroenterology

## 2023-01-26 DIAGNOSIS — Z0000 Encounter for general adult medical examination without abnormal findings: Secondary | ICD-10-CM | POA: Diagnosis not present

## 2023-01-26 DIAGNOSIS — E559 Vitamin D deficiency, unspecified: Secondary | ICD-10-CM | POA: Diagnosis not present

## 2023-01-26 DIAGNOSIS — D352 Benign neoplasm of pituitary gland: Secondary | ICD-10-CM | POA: Diagnosis not present

## 2023-01-26 NOTE — Telephone Encounter (Signed)
Left message for patient to call back. Tentatively scheduled patient for tomorrow at 11:00 am with [place], [place]. Asked that she give me a call back today.

## 2023-01-26 NOTE — Telephone Encounter (Signed)
Spoke with patient & confirmed she can make appointment tomorrow. She also stated she will be seeing cardiology on 2/4 prior to procedure date.

## 2023-01-26 NOTE — Telephone Encounter (Signed)
Patient came in to the office this morning to discuss rectal bleeding. This morning she had an episode of bright red bleeding with clots (which she states is new) that was on tissue paper & small amount mixed in with stool. Previously she was given suppositories which did provide some relief, but says this is new and she is concerned. BM was more loose. Denies any rectal pain, she does have abdominal discomfort however this has been going on for some time now. She's currently scheduled for endo on 02/23/23. She'd like for provider to be aware & provide any further recommendations they may have. She was last seen with Bayley, PA on 01/03/23. She asked that CT report be faxed to PCP office, and mentioned she had labs drawn with their office today. It has also been attached on fax that their office send [place] the lab results for patient as it is not in epic.

## 2023-01-27 ENCOUNTER — Encounter: Payer: Self-pay | Admitting: Nurse Practitioner

## 2023-01-27 ENCOUNTER — Ambulatory Visit (INDEPENDENT_AMBULATORY_CARE_PROVIDER_SITE_OTHER): Payer: BC Managed Care – PPO | Admitting: Nurse Practitioner

## 2023-01-27 VITALS — BP 100/60 | HR 92 | Ht 62.0 in | Wt 150.0 lb

## 2023-01-27 DIAGNOSIS — K625 Hemorrhage of anus and rectum: Secondary | ICD-10-CM | POA: Diagnosis not present

## 2023-01-27 DIAGNOSIS — K648 Other hemorrhoids: Secondary | ICD-10-CM | POA: Diagnosis not present

## 2023-01-27 DIAGNOSIS — R110 Nausea: Secondary | ICD-10-CM | POA: Diagnosis not present

## 2023-01-27 NOTE — Progress Notes (Signed)
ASSESSMENT    Brief Narrative:  23 y.o.  female known to Dr. Chales Abrahams  with a past medical history not limited to pituitary adenoma, SVT s/p ablation    Rectal bleeding, probably hemorrhoidal.  Rule out IBD.  Has a sister with Crohn's disease Already scheduled for colonoscopy in February  IBS-D ( presumed) with intermittent abdominal pain and diarrhea.  Evaluation already underway (seen here twice in December)  Nausea.  Already scheduled for EGD to be done at time of colonoscopy in February  See [facility] for any additional medical & surgical history   PLAN   --Internal hemorrhoids on anoscopy.  Recommend she continue Preparation H suppositories but increase to twice daily and continue for an additional 7 days.  -- Discussed the importance of not sitting on the toilet for prolonged periods of time -- Mother is frustrated about EGD and colonoscopy being booked out so far.  She has a cardiology appointment on 02/07/2023.  If cardiac evaluation is negative then we can see if there are any sooner appointments for EGD and colonoscopy to be done.   HPI   Chief complaint : Still having rectal bleeding. Wants sooner EGD / colonoscopy appointments.   Brief GI History:  Maria [name] was seen by Dr. Chales Abrahams 12/09/22 for evaluation of nausea, vomiting, and diarrhea ( already resolving) and [facility] of Crohn's in sister.  Please refer to that note for details.  In summary we scheduled for an EGD, colonoscopy , labs and stool studies .  Fecal calprotectin was normal.  Her labs were all normal or at least at baseline.  She was seen again in the office 01/03/2023, at that time by Cira Servant, PA for evaluation of rectal bleeding.  She was scheduled for the EGD and colonoscopy as a result of the previous visit but frustrated because the procedures were scheduled so far out.  Bleeding felt most likely hemorrhoidal, she was given hydrocortisone suppositories.  EGD /  colonoscopy removed closer in time to late February.   Following that visit patient had a syncopal episode in the parking lot.  Given this she was going to require cardiac evaluation prior to endoscopic procedures.  In the interim she did have a CT enterography which showed no evidence for IBD despite her sisters history of Crohn's disease.  Patient called the office again yesterday for worsening rectal bleeding and was worked in to see me today having been told that I would do an anoscopy today  Interval History Patient is here with her mother.  Prescription strength steroid suppositories were expensive so using Preparation H suppositories daily instead. Has been using them for about 10 days with improvement in bleeding until yesterday. She admits to sitting on toilet for prolonged periods of time ( sometimes on phone). Over the last couple of years she has had frequent episodes of diarrhea but the bleeding started only a couple of months ago. Still having same lower abdominal pain as discussed at previous visit.  Had repeat CBC done yesterday by PCP ( not in Epic) but doesn't have results.    She has a Cardiology appt on 2/4. No further syncopal episodes since the one she had in her parking lot on 12/31. Mother frustrated. Wants EGD and colonoscopy done sooner. Right now scheduled for 2/20.       Latest Ref Rng & Units 01/03/2023   11:16 AM 12/25/2022   10:24 AM 12/09/2022    3:25 PM  Hepatic Function  Total Protein 6.5 -  8.1 g/dL 7.6  7.2  7.5   Albumin 3.5 - 5.0 g/dL 4.1  3.9  4.5   AST 15 - 41 U/L 20  18  16    ALT 0 - 44 U/L 12  11  9    Alk Phosphatase 38 - 126 U/L 75  70  89   Total Bilirubin 0.0 - 1.2 mg/dL 0.4  0.5  0.5   Bilirubin, Direct 0.0 - 0.2 mg/dL 0.1          Latest Ref Rng & Units 01/03/2023   10:42 AM 12/25/2022   10:24 AM 12/09/2022    3:25 PM  CBC  WBC 4.0 - 10.5 K/uL 11.3  7.7  12.2   Hemoglobin 12.0 - 15.0 g/dL 40.9  81.1  91.4   Hematocrit 36.0 - 46.0 % 40.5  39.5  44.0   Platelets 150 - 400 K/uL 404  399  447.0       Past Medical History:  Diagnosis Date   Headache    Pituitary adenoma (HCC)    SVT (supraventricular tachycardia) (HCC)    Tachycardia     Past Surgical History:  Procedure Laterality Date   ABLATION     ELBOW SURGERY Right     Family History  Problem Relation Age of Onset   Headache Father    Crohn's disease Sister    Depression Neg Hx    Anxiety disorder Neg Hx    Bipolar disorder Neg Hx    Schizophrenia Neg Hx    ADD / ADHD Neg Hx    Autism Neg Hx     Current Medications, Allergies, Family History and Social History were reviewed in [employer] medical record.     Current Outpatient Medications  Medication Sig Dispense Refill   dicyclomine (BENTYL) 10 MG capsule Take 1 capsule (10 mg total) by mouth 2 (two) times daily. For 1 month 60 capsule 2   FLUoxetine (PROZAC) 20 MG capsule Take 1 capsule (20 mg total) by mouth daily. 30 capsule 3   Na Sulfate-K Sulfate-Mg Sulf 17.5-3.13-1.6 GM/177ML SOLN Take 1 kit by mouth as directed. 354 mL 0   ondansetron (ZOFRAN-ODT) 4 MG disintegrating tablet Take 4 mg by mouth as needed.     traZODone (DESYREL) 50 MG tablet Take 1 tablet (50 mg total) by mouth at bedtime as needed for sleep. 30 tablet 3   Vitamin D, Ergocalciferol, (DRISDOL) 1.25 MG (50000 UNIT) CAPS capsule Take 50,000 Units by mouth once a week.     No current facility-administered medications for this visit.    Review of Systems: No chest pain. No shortness of breath. No urinary complaints.    Physical Exam  Filed Weights   01/27/23 1111  Weight: 150 lb (68 kg)   Wt Readings from Last 3 Encounters:  01/27/23 150 lb (68 kg)  01/03/23 141 lb (64 kg)  01/03/23 141 lb 12.8 oz (64.3 kg)    BP 100/60   Pulse 92   Ht 5\' 2"  (1.575 m)   Wt 150 lb (68 kg)   LMP 01/16/2023 (Exact Date)   SpO2 99%   BMI 27.44 kg/m  Constitutional:  Pleasant, generally well appearing female in no acute distress. Psychiatric: Normal mood and affect.  Behavior is normal. EENT: Pupils normal.  Conjunctivae are normal. No scleral icterus. Neck supple.  Cardiovascular: Normal rate, regular rhythm.  Pulmonary/chest: Effort normal and breath sounds normal. No wheezing, rales or rhonchi. Abdominal: Soft, nondistended, nontender. Bowel sounds active throughout. There  are no masses palpable. No hepatomegaly Rectal: No stool or blood in vault. On anoscopy there were moderately swollen hemorrhoids . Neurological: Alert and oriented to person place and time.    Willette Cluster, NP  01/27/2023, 11:31 AM  Cc:  Georgianne Fick, MD

## 2023-01-27 NOTE — Patient Instructions (Signed)
You can purchase Preparation H suppositories over the counter, use twice a day for 7 days  If Cardiology clears you for EGD/Colonoscopy which is already scheduled for 02/23/2023 you can call us to see if we have a sooner appointment.  Please contact us after you see cardiology  Due to recent changes in healthcare laws, you may see the results of your imaging and laboratory studies on MyChart before your provider has had a chance to review them.  We understand that in some cases there may be results that are confusing or concerning to you. Not all laboratory results come back in the same time frame and the provider may be waiting for multiple results in order to interpret others.  Please give [place] 48 hours in order for your provider to thoroughly review all the results before contacting the office for clarification of your results.    I appreciate the  opportunity to care for you  Thank You  Corene Cornea

## 2023-01-29 NOTE — Progress Notes (Signed)
Agree with assessment/plan.  Edman Circle, MD [facility] [phone]

## 2023-01-31 ENCOUNTER — Encounter: Payer: BC Managed Care – PPO | Admitting: Internal Medicine

## 2023-02-02 DIAGNOSIS — K625 Hemorrhage of anus and rectum: Secondary | ICD-10-CM | POA: Diagnosis not present

## 2023-02-02 DIAGNOSIS — Z0000 Encounter for general adult medical examination without abnormal findings: Secondary | ICD-10-CM | POA: Diagnosis not present

## 2023-02-02 DIAGNOSIS — G4710 Hypersomnia, unspecified: Secondary | ICD-10-CM | POA: Diagnosis not present

## 2023-02-02 DIAGNOSIS — K760 Fatty (change of) liver, not elsewhere classified: Secondary | ICD-10-CM | POA: Diagnosis not present

## 2023-02-02 DIAGNOSIS — R55 Syncope and collapse: Secondary | ICD-10-CM | POA: Diagnosis not present

## 2023-02-07 ENCOUNTER — Ambulatory Visit: Payer: BC Managed Care – PPO

## 2023-02-07 ENCOUNTER — Ambulatory Visit: Payer: BC Managed Care – PPO | Attending: Cardiology

## 2023-02-07 ENCOUNTER — Encounter: Payer: Self-pay | Admitting: Cardiology

## 2023-02-07 ENCOUNTER — Ambulatory Visit: Payer: BC Managed Care – PPO | Attending: Cardiology | Admitting: Cardiology

## 2023-02-07 VITALS — BP 104/72 | HR 99 | Ht 62.0 in | Wt 158.2 lb

## 2023-02-07 DIAGNOSIS — Z8679 Personal history of other diseases of the circulatory system: Secondary | ICD-10-CM | POA: Diagnosis not present

## 2023-02-07 DIAGNOSIS — R55 Syncope and collapse: Secondary | ICD-10-CM

## 2023-02-07 DIAGNOSIS — R002 Palpitations: Secondary | ICD-10-CM | POA: Diagnosis not present

## 2023-02-07 NOTE — Progress Notes (Unsigned)
Enrolled for [employer] to mail a ZIO XT long term holter monitor to the patients address on file.  First of 2 consecutive 14 day ZIO XT monitors.

## 2023-02-07 NOTE — Assessment & Plan Note (Signed)
Palpitations in a patient with history of SVT, will check back-to-back Zio patch monitors to ensure that we capture an episode.

## 2023-02-07 NOTE — Progress Notes (Unsigned)
Enrolled for [employer] to mail a ZIO XT long term holter monitor to the patients address on file. Requested to be mailed 02/17/23. Second consecutive 14 day ZIO monitors

## 2023-02-07 NOTE — Assessment & Plan Note (Addendum)
 Syncopal episode occurred initially clinic environment.  Not necessarily eating and drinking very well with upset stomach.  She felt lightheaded and flushed prior to the episode.  But she did not notice any tachycardia.  Need to exclude recurrence of SVT, but most likely etiology is vasovagal syncope associated with abnormal GI symptoms and flushing.  -Order 14-day Zio monitor x2 to capture any arrhythmias during symptomatic episodes. -Order echocardiogram to confirm no structural changes since last echo in 2020. -Advise on hydration, especially during menstrual cycle and prior to colonoscopy/endoscopy. -Recommend compression socks and core strengthening exercises.

## 2023-02-07 NOTE — Progress Notes (Signed)
 Cardiology Office Note:  .   Date:  02/07/2023  ID:  Geroge [name], DOB 10/01/2000, MRN [id number] PCP: Verdia Lombard, MD  [facility] Providers Cardiologist:  Alm Clay, MD     Chief Complaint  Patient presents with   New Patient (Initial Visit)    Syncope; history of SVT    Patient Profile: .     Maria [name] is a very pleasant 23 y.o. female  with a PMH notable for headaches, pituitary adenoma and h/o  SVT (s/p ablation in 2019 [facility]) who presents here for ER follow-up For Syncope at the request of Mollie Nestor HERO, [place]*.  She had previously been followed by [facility] SVT -initial ablation in December 2018; redo ablation October 2019 (RFA) - Initial evaluation September 2017 denied any history of frank syncope, but did have near syncope.  Mostly noted a squeezing chest tightness with her tachycardia episodes. Last visit for [facility] was 03/07/2018: Reassuring physical exam, electrocardiogram and echocardiogram.  Having chest pain episodes but not likely thought to be related to cardiac issues.  She is complaining of sharp stabbing chest pains.   Maria [name] was last seen at [facility], ER on 01/03/2023 for an episode of syncope.  Apparently she had had several episodes of syncope since diagnosis of pituitary adenoma in August 2024.  For this episode she apparently was leaving the office and felt as though she was in a fall.  She felt nauseated and hot.  She had some BRBPR for which she went to the doctor.  She felt nauseated not and was assisted to the floor, did not follow-up.  No chest pain or dyspnea.  It is unclear whether this was arrhythmogenic or related to metabolic derangement.  She was treated with 1 L bolus and felt well.  Discharged home.  Subjective  Discussed the use of AI scribe software for clinical note transcription with the patient, who gave verbal consent to proceed.  History of Present Illness    Maria [name] is a 23 year old female with h/o Recurrent supraventricular tachycardia (s/p Ablation x 2) who presents with episodes of syncope and palpitations. She was referred by Cloretta Plough for evaluation of her cardiac symptoms.  Over the past two months, she has experienced approximately six episodes of syncope, with the most recent occurring a few weeks ago. These episodes are often accompanied by a fast heart rate, though not as rapid as during previous SVT episodes. She feels lightheaded and experiences hot flashes before passing out, typically while walking or standing, but not when lying down. Palpitations are noted during her menstrual cycle, which were not present before her ablations.  She has a history of supraventricular tachycardia (SVT) and has undergone two heart ablations, the first at age 73 and the second at age 86, due to heart palpitations. Her last ablation was in October 2019. Her heart rate was once recorded at 230 beats per minute after a volleyball game, leading to an emergency room visit - treated with Adenosine . She has not had an echocardiogram since March 2020, which showed a structurally normal heart with a 70% ejection fraction.  There is a family history of heart disease, with both grandfathers having had heart issues, and her father and paternal grandfather having high blood pressure.  She is cautious about caffeine intake and acknowledges needing to improve her hydration. She is currently on Bentyl  for IBS-related symptoms, which include stomach pain after eating, and has been on Prozac   for six months. She was previously on an antidepressant that was discontinued due to its effect on her hormonal levels, which are being monitored due to a pituitary adenoma.        Objective   Family History  Problem Relation Age of Onset   Hypertension Father   Hypertension Paternal Grandfather   Heart attack Paternal Grandfather   Congenital heart disease Neg Hx    Current Meds  Medication Sig   dicyclomine  (BENTYL ) 10 MG capsule Take 1 capsule (10 mg total) by mouth 2 (two) times daily. For 1 month   FLUoxetine  (PROZAC ) 20 MG capsule Take 1 capsule (20 mg total) by mouth daily.   Vitamin D, Ergocalciferol, (DRISDOL) 1.25 MG (50000 UNIT) CAPS capsule Take 50,000 Units by mouth once a week.  She was started on Bentyl  for her ongoing GI issues.   Studies Reviewed: SABRA        [facility] Pediatric Echo (03/31/2016)-[facility]: Normal LV size and function.  Trivial MR.  Normal RV size function.-Structurally normal heart Zio patch monitor revealed heart rates as high as 245 bpm initially treated with adenosine  EP Study-SVT Ablation (12/19/2016): Inducible orthodromic SVT using a concealed bypass tract left posterior concealed bypass tract treated with RFA.  No inducible SVT following ablation and retrograde block at baseline following ablation. Follow-up Echo post ablation: Structurally normal EP Study-SVT Ablation (10/16/2017): Redo ablation: Occasional inducible SVT at baseline using concealed bypass tract unsuccessful RFA ablation of left posterior septal accessory pathway..  Evidence of dual AV node physiology Follow-up Echo in PACU normal Echo 03/07/2018: Normal LV size and function.  Normal RV size and function.  Proximal coronary artery structures normal.  No effusion.  Structurally normal heart.  EF estimated> 70%  Lab Results  Component Value Date   NA 135 01/03/2023   K 3.3 (L) 01/03/2023   CREATININE 0.72 01/03/2023   GFRNONAA >60 01/03/2023   GLUCOSE 99 01/03/2023   Lab Results  Component Value Date   HGBA1C 5.5 08/05/2022   No results found for: TSH   Risk Assessment/Calculations:            Physical Exam:   VS:  BP 104/72 (BP Location: Left Arm, Patient Position: Standing, Cuff Size: Normal)   Pulse 99   Ht 5' 2 (1.575 m)   Wt 158 lb 3.2 oz (71.8 kg)   LMP 01/16/2023 (Exact Date)   SpO2 98%   BMI 28.94  kg/m    Wt Readings from Last 3 Encounters:  02/07/23 158 lb 3.2 oz (71.8 kg)  01/27/23 150 lb (68 kg)  01/03/23 141 lb (64 kg)    GEN: Well nourished, well groomed in no acute distress; healthy-appearing NECK: No JVD; No carotid bruits CARDIAC: Normal S1, S2; RRR, no murmurs, rubs, gallops RESPIRATORY:  Clear to auscultation without rales, wheezing or rhonchi ; nonlabored, good air movement. ABDOMEN: Soft, non-tender, non-distended EXTREMITIES:  No edema; No deformity     ASSESSMENT AND PLAN: .    Problem List Items Addressed This Visit     History of supraventricular tachycardia   Supraventricular Tachycardia (SVT) History of two ablations, last one in 2019. Recent episodes of syncope and palpitations, some associated with menstrual cycle. No structural abnormalities on previous echocardiograms. -Order 14-day Zio monitor x2 to capture any arrhythmias during symptomatic episodes. -Order echocardiogram to confirm no structural changes since last echo in 2020. -Advise on hydration, especially during menstrual cycle and prior to colonoscopy/endoscopy. -Recommend compression socks and core strengthening  exercises.      Relevant Orders   ECHOCARDIOGRAM COMPLETE   LONG TERM MONITOR (3-14 DAYS)   LONG TERM MONITOR (3-14 DAYS)   Palpitations (Chronic)   Palpitations in a patient with history of SVT, will check back-to-back Zio patch monitors to ensure that we capture an episode.      Relevant Orders   LONG TERM MONITOR (3-14 DAYS)   LONG TERM MONITOR (3-14 DAYS)   Syncope and collapse - Primary (Chronic)   Syncopal episode occurred initially clinic environment.  Not necessarily eating and drinking very well with upset stomach.  She felt lightheaded and flushed prior to the episode.  But she did not notice any tachycardia.  Need to exclude recurrence of SVT, but most likely etiology is vasovagal syncope associated with abnormal GI symptoms and flushing.  -Order 14-day Zio monitor  x2 to capture any arrhythmias during symptomatic episodes. -Order echocardiogram to confirm no structural changes since last echo in 2020. -Advise on hydration, especially during menstrual cycle and prior to colonoscopy/endoscopy. -Recommend compression socks and core strengthening exercises.      Relevant Orders   ECHOCARDIOGRAM COMPLETE   LONG TERM MONITOR (3-14 DAYS)   LONG TERM MONITOR (3-14 DAYS)    Gastrointestinal Issues Reports stomach pain after eating, scheduled for colonoscopy and endoscopy. -Advise on maintaining hydration during bowel prep for procedures.  Pituitary Adenoma Undergoing regular blood work monitoring. -No specific plan discussed in this conversation.  Depression Currently on Prozac  for six months. -Continue current treatment, no changes discussed in this conversation.  Follow-up after completion of tests. Return in about 2 months (around 04/07/2023) for Routine Follow-up after testing ~ 1-2 months.     Signed, Alm MICAEL Clay, MD, MS Alm Clay, M.D., M.S. Interventional Cardiologist  [facility]  Pager # [phone] Phone # [phone] [address] [place], [place] [address]

## 2023-02-07 NOTE — Patient Instructions (Addendum)
 Medication Instructions:   No changes    Lab Work: Not  needed    Testing/Procedures:  Will be  schedule Your physician has requested that you have an echocardiogram. Echocardiography is a painless test that uses sound waves to create images of your heart. It provides your doctor with information about the size and shape of your heart and how well your heart's chambers and valves are working. This procedure takes approximately one hour. There are no restrictions for this procedure. Please do NOT wear cologne, perfume, aftershave, or lotions (deodorant is allowed). Please arrive 15 minutes prior to your appointment time.  Please note: We ask at that you not bring children with you during ultrasound (echo/ vascular) testing. Due to room size and safety concerns, children are not allowed in the ultrasound rooms during exams. Our front office staff cannot provide observation of children in our lobby area while testing is being conducted. An adult accompanying a patient to their appointment will only be allowed in the ultrasound room at the discretion of the ultrasound technician under special circumstances. We apologize for any inconvenience.  and Will be mailed to you in 3 to 7 days  - you will wear a monitor  for a total of 4 weeks- in 2 week increments Your physician has recommended that you wear a holter monitor 14 day Zio  x2 . Holter monitors are medical devices that record the heart's electrical activity. Doctors most often use these monitors to diagnose arrhythmias. Arrhythmias are problems with the speed or rhythm of the heartbeat. The monitor is a small, portable device. You can wear one while you do your normal daily activities. This is usually used to diagnose what is causing palpitations/syncope (passing out).   Follow-Up: At [facility], you and your health needs are our priority.  As part of our continuing mission to provide you with exceptional heart care, we have created  designated Provider Care Teams.  These Care Teams include your primary Cardiologist (physician) and Advanced Practice Providers (APPs -  Physician Assistants and Nurse Practitioners) who all work together to provide you with the care you need, when you need it.     Your next appointment:   2 month(s)  The format for your next appointment:   In Person  Provider:   Alm Clay, MD    Other Instructions  Wear support socks Hydrate Particular hydrate while you are  prepping for Colonoscopy --  rehydration drinks Try to more core strength exercises      ZIO XT- Long Term Monitor Instructions  Your physician has requested you wear a ZIO patch monitor for 14 days x 2.  This is a single patch monitor. [employer] supplies one patch monitor per enrollment. Additional stickers are not available. Please do not apply patch if you will be having a Nuclear Stress Test,  Echocardiogram, Cardiac CT, MRI, or Chest Xray during the period you would be wearing the  monitor. The patch cannot be worn during these tests. You cannot remove and re-apply the  ZIO XT patch monitor.  Your ZIO patch monitor will be mailed 3 day USPS to your address on file. It may take 3-5 days  to receive your monitor after you have been enrolled.  Once you have received your monitor, please review the enclosed instructions. Your monitor  has already been registered assigning a specific monitor serial # to you.  Billing and Patient Assistance Program Information  We have supplied [employer] with any of your insurance information  on file for billing purposes. [employer] offers a sliding scale Patient Assistance Program for patients that do not have  insurance, or whose insurance does not completely cover the cost of the ZIO monitor.  You must apply for the Patient Assistance Program to qualify for this discounted rate.  To apply, please call [employer] at [phone], select option 4, select option 2, ask to apply for  Patient  Assistance Program. Meredeth will ask your household income, and how many people  are in your household. They will quote your out-of-pocket cost based on that information.  [employer] will also be able to set up a 9-month, interest-free payment plan if needed.  Applying the monitor   Shave hair from upper left chest.  Hold abrader disc by orange tab. Rub abrader in 40 strokes over the upper left chest as  indicated in your monitor instructions.  Clean area with 4 enclosed alcohol pads. Let dry.  Apply patch as indicated in monitor instructions. Patch will be placed under collarbone on left  side of chest with arrow pointing upward.  Rub patch adhesive wings for 2 minutes. Remove white label marked 1. Remove the white  label marked 2. Rub patch adhesive wings for 2 additional minutes.  While looking in a mirror, press and release button in center of patch. A small green light will  flash 3-4 times. This will be your only indicator that the monitor has been turned on.  Do not shower for the first 24 hours. You may shower after the first 24 hours.  Press the button if you feel a symptom. You will hear a small click. Record Date, Time and  Symptom in the Patient Logbook.  When you are ready to remove the patch, follow instructions on the last 2 pages of Patient  Logbook. Stick patch monitor onto the last page of Patient Logbook.  Place Patient Logbook in the blue and white box. Use locking tab on box and tape box closed  securely. The blue and white box has prepaid postage on it. Please place it in the mailbox as  soon as possible. Your physician should have your test results approximately 7 days after the  monitor has been mailed back to [facility].  Call [facility] Customer Care at [phone] if you have questions regarding  your ZIO XT patch monitor. Call them immediately if you see an orange light blinking on your  monitor.  If your monitor falls off in less than 4 days,  contact our Monitor department at [phone].  If your monitor becomes loose or falls off after 4 days call [employer] at [phone] for  suggestions on securing your monitor

## 2023-02-07 NOTE — Assessment & Plan Note (Signed)
 Supraventricular Tachycardia (SVT) History of two ablations, last one in 2019. Recent episodes of syncope and palpitations, some associated with menstrual cycle. No structural abnormalities on previous echocardiograms. -Order 14-day Zio monitor x2 to capture any arrhythmias during symptomatic episodes. -Order echocardiogram to confirm no structural changes since last echo in 2020. -Advise on hydration, especially during menstrual cycle and prior to colonoscopy/endoscopy. -Recommend compression socks and core strengthening exercises.

## 2023-02-08 ENCOUNTER — Telehealth: Payer: Self-pay | Admitting: Nurse Practitioner

## 2023-02-08 DIAGNOSIS — K625 Hemorrhage of anus and rectum: Secondary | ICD-10-CM

## 2023-02-08 DIAGNOSIS — R197 Diarrhea, unspecified: Secondary | ICD-10-CM

## 2023-02-08 DIAGNOSIS — R112 Nausea with vomiting, unspecified: Secondary | ICD-10-CM

## 2023-02-08 NOTE — Telephone Encounter (Signed)
 There are no sooner appointments, at this time she has the best appointment for a double procedure.   Vina LIPS Please read office note from cardiology. You wanted her to get cardiac clearance. It looks like they cleared her but to stay hydrated during her prep. She is scheduled for 2/20.  Is this good enough to proceed with the procedure   Thanks Aya Geisel

## 2023-02-08 NOTE — Telephone Encounter (Signed)
 Inbound call from patient stating she received cardiology clearance for up coming procedure. Please advise, thank you.

## 2023-02-09 NOTE — Telephone Encounter (Signed)
 Maria [name], Thank you. I am adding Dr. Charlanne.  He wanted cardiac clearance because she had a syncopal episode after leaving our office.   It sounds like cardiology is okay with her proceeding with EGD / colonoscopy though they have put some kind of a monitor on her to wear over the next couple of weeks.  I assume Dr. Charlanne will be okay with proceeding but will wait to hear.   Dr. Charlanne is out of the office but will be back in a few days but we still have plenty of time to hear from him before her procedure date. Thanks Maria [name]   Noted will wait for Charlanne to respond

## 2023-02-10 NOTE — Telephone Encounter (Signed)
 Patient has been rescheduled to 03/21/23 to give time for cardiology workup.

## 2023-02-10 NOTE — Telephone Encounter (Signed)
===  View-only below this line=== ----- Message ----- From: Claudene Naomie SAILOR, RN Sent: 02/10/2023  12:00 AM EST To: Naomie SAILOR Claudene, RN  Left message for patient to call back. ----- Message ----- From: Claudene Naomie SAILOR, RN Sent: 02/09/2023  12:00 AM EST To: Naomie SAILOR Claudene, RN  Left message for patient to call back. We will need to move her procedure to at least March. Echo is scheduled for 02/27/23. ----- Message ----- From: Mollie Nestor CHRISTELLA DEVONNA Sent: 02/08/2023   8:28 AM EST To: Naomie SAILOR Claudene, RN  Reviewed note from cardiology.  Appears she is currently getting workup including echo and heart monitor.  She is scheduled for February 20.  If she is able to get these done with cardiac clearance prior to her procedure she can keep her current procedure date.  Otherwise we will need to push back until she is cleared by cardiology ----- Message -----

## 2023-02-13 ENCOUNTER — Encounter: Payer: Self-pay | Admitting: Cardiology

## 2023-02-13 NOTE — Telephone Encounter (Signed)
 Error

## 2023-02-14 NOTE — Telephone Encounter (Signed)
Agree with currently schedule EGD/colon. That will give enough time for cardiology workup to be complete. Thank you both for your help RG

## 2023-02-16 NOTE — Telephone Encounter (Signed)
Maria [name], Patient is scheduled with Leone Payor instead of Chales Abrahams that you scheduled. Is this correct because of a sooner appointment?

## 2023-02-23 ENCOUNTER — Encounter: Payer: BC Managed Care – PPO | Admitting: Internal Medicine

## 2023-02-27 ENCOUNTER — Encounter: Payer: Self-pay | Admitting: Cardiology

## 2023-02-27 ENCOUNTER — Ambulatory Visit (HOSPITAL_COMMUNITY): Payer: BC Managed Care – PPO | Attending: Cardiology

## 2023-02-27 DIAGNOSIS — R55 Syncope and collapse: Secondary | ICD-10-CM | POA: Diagnosis not present

## 2023-02-27 DIAGNOSIS — Z8679 Personal history of other diseases of the circulatory system: Secondary | ICD-10-CM | POA: Insufficient documentation

## 2023-02-27 LAB — ECHOCARDIOGRAM COMPLETE
Area-P 1/2: 4.21 cm2
S' Lateral: 3.2 cm

## 2023-03-01 ENCOUNTER — Encounter: Payer: BC Managed Care – PPO | Admitting: Gastroenterology

## 2023-03-02 ENCOUNTER — Telehealth: Payer: Self-pay | Admitting: *Deleted

## 2023-03-02 NOTE — Telephone Encounter (Signed)
 Request for surgical clearance:     Endoscopy Procedure  What type of surgery is being performed?     Endoscopy/colonoscopy  When is this surgery scheduled?     03/09/23  What type of clearance is required ?   Medical/Cardiac  Are there any medications that need to be held prior to surgery and how long? N/A  Practice name and name of physician performing surgery?      [facility]  What is your office phone and fax number?      Phone- [phone]  Fax- [phone]  Anesthesia type (None, local, MAC, general) ?       MAC  Please route your response to Vernia Buff, RN

## 2023-03-03 NOTE — Telephone Encounter (Addendum)
 Cardiology has given cardiac clearance for patient to proceed with endoscopy/colonoscopy. This appointment has been changed to 03/09/23 at 10 am with Dr Chales Abrahams. Patient is in agreement with plan.  Of note: patient states that she completed zio monitor x 2 weeks, but discontinued prior to full 4 week monitor due to "whelps" on the skin from the electrodes.

## 2023-03-03 NOTE — Telephone Encounter (Signed)
 I think she is fine for endoscopy.  Very low risk procedure no reason for any cardiac evaluation for an endoscopy procedure.

## 2023-03-03 NOTE — Addendum Note (Signed)
 Addended by: Richardson Chiquito on: 03/03/2023 11:20 AM   Modules accepted: Orders

## 2023-03-03 NOTE — Telephone Encounter (Signed)
   Patient Name: Maria [name]  DOB: 08/01/2000 MRN: [id number]  Primary Cardiologist: Bryan Lemma, MD  Chart reviewed as part of pre-operative protocol coverage. Given past medical history and time since last visit, based on ACC/AHA guidelines, [name] is at acceptable risk for the planned procedure without further cardiovascular testing.   Per Dr. Herbie Baltimore, who saw the pt in clinic on 02/07/2023, "I think she is fine for endoscopy.  Very low risk procedure no reason for any cardiac evaluation for an endoscopy procedure."  I will route this recommendation to the requesting party via Epic fax function and remove from pre-op pool.  Please call with questions.  Joylene Grapes, NP 03/03/2023, 8:01 AM

## 2023-03-09 ENCOUNTER — Ambulatory Visit: Payer: BC Managed Care – PPO | Admitting: Gastroenterology

## 2023-03-09 ENCOUNTER — Encounter: Payer: Self-pay | Admitting: Gastroenterology

## 2023-03-09 VITALS — BP 134/85 | HR 82 | Temp 98.4°F | Resp 16 | Ht 62.0 in | Wt 150.0 lb

## 2023-03-09 DIAGNOSIS — K625 Hemorrhage of anus and rectum: Secondary | ICD-10-CM

## 2023-03-09 DIAGNOSIS — R110 Nausea: Secondary | ICD-10-CM

## 2023-03-09 DIAGNOSIS — K2950 Unspecified chronic gastritis without bleeding: Secondary | ICD-10-CM

## 2023-03-09 DIAGNOSIS — K529 Noninfective gastroenteritis and colitis, unspecified: Secondary | ICD-10-CM

## 2023-03-09 DIAGNOSIS — R197 Diarrhea, unspecified: Secondary | ICD-10-CM

## 2023-03-09 MED ORDER — SODIUM CHLORIDE 0.9 % IV SOLN: 500.0000 mL | Freq: Once | INTRAVENOUS | Status: DC

## 2023-03-09 NOTE — Progress Notes (Signed)
 Called to room to assist during endoscopic procedure.  Patient ID and intended procedure confirmed with present staff. Received instructions for my participation in the procedure from the performing physician.

## 2023-03-09 NOTE — Patient Instructions (Signed)
 The doctor biopsied your colon and your stomach and small bowel and colon for issues.  We will contact you via letter for further instructions and results.  Read your discharge instructions.  YOU HAD AN ENDOSCOPIC PROCEDURE TODAY AT THE [facility]:   Refer to the procedure report that was given to you for any specific questions about what was found during the examination.  If the procedure report does not answer your questions, please call your gastroenterologist to clarify.  If you requested that your care partner not be given the details of your procedure findings, then the procedure report has been included in a sealed envelope for you to review at your convenience later.  YOU SHOULD EXPECT: Some feelings of bloating in the abdomen. Passage of more gas than usual.  Walking can help get rid of the air that was put into your GI tract during the procedure and reduce the bloating. If you had a lower endoscopy (such as a colonoscopy or flexible sigmoidoscopy) you may notice spotting of blood in your stool or on the toilet paper. If you underwent a bowel prep for your procedure, you may not have a normal bowel movement for a few days.  Please Note:  You might notice some irritation and congestion in your nose or some drainage.  This is from the oxygen used during your procedure.  There is no need for concern and it should clear up in a day or so.  SYMPTOMS TO REPORT IMMEDIATELY:  Following lower endoscopy (colonoscopy or flexible sigmoidoscopy):  Excessive amounts of blood in the stool  Significant tenderness or worsening of abdominal pains  Swelling of the abdomen that is new, acute  Fever of 100F or higher  Following upper endoscopy (EGD)  Vomiting of blood or coffee ground material  New chest pain or pain under the shoulder blades  Painful or persistently difficult swallowing  New shortness of breath  Fever of 100F or higher  Black, tarry-looking stools  For urgent or emergent  issues, a gastroenterologist can be reached at any hour by calling [phone]. Do not use MyChart messaging for urgent concerns.    DIET:  We do recommend a small meal at first, but then you may proceed to your regular diet.  Drink plenty of fluids but you should avoid alcoholic beverages for 24 hours.  ACTIVITY:  You should plan to take it easy for the rest of today and you should NOT DRIVE or use heavy machinery until tomorrow (because of the sedation medicines used during the test).    FOLLOW UP: Our staff will call the number listed on your records the next business day following your procedure.  We will call around 7:15- 8:00 am to check on you and address any questions or concerns that you may have regarding the information given to you following your procedure. If we do not reach you, we will leave a message.     If any biopsies were taken you will be contacted by phone or by letter within the next 1-3 weeks.  Please call us at [phone] if you have not heard about the biopsies in 3 weeks.    SIGNATURES/CONFIDENTIALITY: You and/or your care partner have signed paperwork which will be entered into your electronic medical record.  These signatures attest to the fact that that the information above on your After Visit Summary has been reviewed and is understood.  Full responsibility of the confidentiality of this discharge information lies with you and/or  your care-partner.

## 2023-03-09 NOTE — Progress Notes (Signed)
 ASSESSMENT     Brief Narrative:  23 y.o.  female known to Dr. Chales Abrahams  with a past medical history not limited to pituitary adenoma, SVT s/p ablation     Rectal bleeding, probably hemorrhoidal.  Rule out IBD.  Has a sister with Crohn's disease Already scheduled for colonoscopy in February   IBS-D ( presumed) with intermittent abdominal pain and diarrhea.  Evaluation already underway (seen here twice in December)   Nausea.  Already scheduled for EGD to be done at time of colonoscopy in February   See [facility] for any additional medical & surgical history    PLAN    --Internal hemorrhoids on anoscopy.  Recommend she continue Preparation H suppositories but increase to twice daily and continue for an additional 7 days.  -- Discussed the importance of not sitting on the toilet for prolonged periods of time -- Mother is frustrated about EGD and colonoscopy being booked out so far.  She has a cardiology appointment on 02/07/2023.  If cardiac evaluation is negative then we can see if there are any sooner appointments for EGD and colonoscopy to be done.    HPI    Chief complaint : Still having rectal bleeding. Wants sooner EGD / colonoscopy appointments.    Brief GI History:  Colin was seen by Dr. Chales Abrahams 12/09/22 for evaluation of nausea, vomiting, and diarrhea ( already resolving) and [facility] Crohn's in sister.  Please refer to that note for details.  In summary we scheduled for an EGD, colonoscopy , labs and stool studies .  Fecal calprotectin was normal.  Her labs were all normal or at least at baseline.  She was seen again in the office 01/03/2023, at that time by Cira Servant, PA for evaluation of rectal bleeding.  She was scheduled for the EGD and colonoscopy as a result of the previous visit but frustrated because the procedures were scheduled so far out.  Bleeding felt most likely hemorrhoidal, she was given hydrocortisone suppositories.  EGD /  colonoscopy removed closer in time to  late February.  Following that visit patient had a syncopal episode in the parking lot.  Given this she was going to require cardiac evaluation prior to endoscopic procedures.  In the interim she did have a CT enterography which showed no evidence for IBD despite her sisters history of Crohn's disease.  Patient called the office again yesterday for worsening rectal bleeding and was worked in to see me today having been told that I would do an anoscopy today   Interval History Patient is here with her mother.  Prescription strength steroid suppositories were expensive so using Preparation H suppositories daily instead. Has been using them for about 10 days with improvement in bleeding until yesterday. She admits to sitting on toilet for prolonged periods of time ( sometimes on phone). Over the last couple of years she has had frequent episodes of diarrhea but the bleeding started only a couple of months ago. Still having same lower abdominal pain as discussed at previous visit.  Had repeat CBC done yesterday by PCP ( not in Epic) but doesn't have results.     She has a Cardiology appt on 2/4. No further syncopal episodes since the one she had in her parking lot on 12/31. Mother frustrated. Wants EGD and colonoscopy done sooner. Right now scheduled for 2/20.          Latest Ref Rng & Units 01/03/2023   11:16 AM 12/25/2022  10:24 AM 12/09/2022    3:25 PM  Hepatic Function  Total Protein 6.5 - 8.1 g/dL 7.6  7.2  7.5   Albumin 3.5 - 5.0 g/dL 4.1  3.9  4.5   AST 15 - 41 U/L 20  18  16    ALT 0 - 44 U/L 12  11  9    Alk Phosphatase 38 - 126 U/L 75  70  89   Total Bilirubin 0.0 - 1.2 mg/dL 0.4  0.5  0.5   Bilirubin, Direct 0.0 - 0.2 mg/dL 0.1               Latest Ref Rng & Units 01/03/2023   10:42 AM 12/25/2022   10:24 AM 12/09/2022    3:25 PM  CBC  WBC 4.0 - 10.5 K/uL 11.3  7.7  12.2   Hemoglobin 12.0 - 15.0 g/dL 24.4  01.0  27.2   Hematocrit 36.0 - 46.0 % 40.5  39.5  44.0   Platelets 150 - 400  K/uL 404  399  447.0             Past Medical History:  Diagnosis Date   Headache     Pituitary adenoma (HCC)     SVT (supraventricular tachycardia) (HCC)     Tachycardia                 Past Surgical History:  Procedure Laterality Date   ABLATION       ELBOW SURGERY Right                 Family History  Problem Relation Age of Onset   Headache Father     Crohn's disease Sister     Depression Neg Hx     Anxiety disorder Neg Hx     Bipolar disorder Neg Hx     Schizophrenia Neg Hx     ADD / ADHD Neg Hx     Autism Neg Hx            Current Medications, Allergies, Family History and Social History were reviewed in [employer] medical record.           Current Outpatient Medications  Medication Sig Dispense Refill   dicyclomine (BENTYL) 10 MG capsule Take 1 capsule (10 mg total) by mouth 2 (two) times daily. For 1 month 60 capsule 2   FLUoxetine (PROZAC) 20 MG capsule Take 1 capsule (20 mg total) by mouth daily. 30 capsule 3   Na Sulfate-K Sulfate-Mg Sulf 17.5-3.13-1.6 GM/177ML SOLN Take 1 kit by mouth as directed. 354 mL 0   ondansetron (ZOFRAN-ODT) 4 MG disintegrating tablet Take 4 mg by mouth as needed.       traZODone (DESYREL) 50 MG tablet Take 1 tablet (50 mg total) by mouth at bedtime as needed for sleep. 30 tablet 3   Vitamin D, Ergocalciferol, (DRISDOL) 1.25 MG (50000 UNIT) CAPS capsule Take 50,000 Units by mouth once a week.          No current facility-administered medications for this visit.        Review of Systems: No chest pain. No shortness of breath. No urinary complaints.      Physical Exam      Filed Weights    01/27/23 1111  Weight: 150 lb (68 kg)       Wt Readings from Last 3 Encounters:  01/27/23 150 lb (68 kg)  01/03/23 141 lb (64 kg)  01/03/23 141 lb 12.8 oz (64.3 kg)  BP 100/60   Pulse 92   Ht 5\' 2"  (1.575 m)   Wt 150 lb (68 kg)   LMP 01/16/2023 (Exact Date)   SpO2 99%   BMI 27.44 kg/m   Constitutional:  Pleasant, generally well appearing female in no acute distress. Psychiatric: Normal mood and affect. Behavior is normal. EENT: Pupils normal.  Conjunctivae are normal. No scleral icterus. Neck supple.  Cardiovascular: Normal rate, regular rhythm.  Pulmonary/chest: Effort normal and breath sounds normal. No wheezing, rales or rhonchi. Abdominal: Soft, nondistended, nontender. Bowel sounds active throughout. There are no masses palpable. No hepatomegaly Rectal: No stool or blood in vault. On anoscopy there were moderately swollen hemorrhoids . Neurological: Alert and oriented to person place and time.      Willette Cluster, NP   Attending physician's note   I have taken history, reviewed the chart and examined the patient. I performed a substantive portion of this encounter, including complete performance of at least one of the key components, in conjunction with the APP. I agree with the Advanced Practitioner's note, impression and recommendations.   For EGD/colon today   Edman Circle, MD [facility] [phone]

## 2023-03-09 NOTE — Op Note (Signed)
 [facility] Patient Name: Maria [name] Procedure Date: 03/09/2023 9:02 AM MRN: [id number] Endoscopist: Lynann Bologna , MD, [id number] Age: 23 Referring MD:  Date of Birth: [birth year] Gender: Female Account #: [id number] Procedure:                Upper GI endoscopy Indications:              Chronic nausea. Medicines:                Monitored Anesthesia Care Procedure:                Pre-Anesthesia Assessment:                           - Prior to the procedure, a History and Physical                            was performed, and patient medications and                            allergies were reviewed. The patient's tolerance of                            previous anesthesia was also reviewed. The risks                            and benefits of the procedure and the sedation                            options and risks were discussed with the patient.                            All questions were answered, and informed consent                            was obtained. Prior Anticoagulants: The patient has                            taken no anticoagulant or antiplatelet agents. ASA                            Grade Assessment: II - A patient with mild systemic                            disease. After reviewing the risks and benefits,                            the patient was deemed in satisfactory condition to                            undergo the procedure.                           After obtaining informed consent, the endoscope was  passed under direct vision. Throughout the                            procedure, the patient's blood pressure, pulse, and                            oxygen saturations were monitored continuously. The                            Olympus Scope [id number] was introduced through the                            mouth, and advanced to the second part of duodenum.                            The upper GI endoscopy was  accomplished without                            difficulty. The patient tolerated the procedure                            well. Scope In: Scope Out: Findings:                 The examined esophagus was normal.                           The Z-line was regular and was found 35 cm from the                            incisors.                           The entire examined stomach was normal. Biopsies                            were taken with a cold forceps for histology.                           The examined duodenum was normal. Biopsies for                            histology were taken with a cold forceps for                            evaluation of celiac disease. Complications:            No immediate complications. Estimated Blood Loss:     Estimated blood loss: none. Impression:               - Normal EGD. Recommendation:           - Patient has a contact number available for                            emergencies. The signs and symptoms of potential  delayed complications were discussed with the                            patient. Return to normal activities tomorrow.                            Written discharge instructions were provided to the                            patient.                           - Resume previous diet.                           - Continue present medications.                           - Await pathology results.                           - The findings and recommendations were discussed                            with the patient's family. Lynann Bologna, MD 03/09/2023 9:37:22 AM This report has been signed electronically.

## 2023-03-09 NOTE — Op Note (Signed)
 [facility] Patient Name: Maria [name] Procedure Date: 03/09/2023 8:57 AM MRN: [id number] Endoscopist: Lynann Bologna , MD, [id number] Age: 23 Referring MD:  Date of Birth: 04/22/2000 Gender: Female Account #: [id number] Procedure:                Colonoscopy Indications:              Chronic diarrhea, Rectal bleeding. FH Crohns (sis) Medicines:                Monitored Anesthesia Care Procedure:                Pre-Anesthesia Assessment:                           - Prior to the procedure, a History and Physical                            was performed, and patient medications and                            allergies were reviewed. The patient's tolerance of                            previous anesthesia was also reviewed. The risks                            and benefits of the procedure and the sedation                            options and risks were discussed with the patient.                            All questions were answered, and informed consent                            was obtained. Prior Anticoagulants: The patient has                            taken no anticoagulant or antiplatelet agents. ASA                            Grade Assessment: II - A patient with mild systemic                            disease. After reviewing the risks and benefits,                            the patient was deemed in satisfactory condition to                            undergo the procedure.                           After obtaining informed consent, the colonoscope  was passed under direct vision. Throughout the                            procedure, the patient's blood pressure, pulse, and                            oxygen saturations were monitored continuously. The                            [id number] Colonoscope [id number] was introduced                            through the anus and advanced to the 5 cm into the                            ileum.  The colonoscopy was performed without                            difficulty. The patient tolerated the procedure                            well. The quality of the bowel preparation was                            good. The terminal ileum, ileocecal valve,                            appendiceal orifice, and rectum were photographed. Scope In: 9:38:58 AM Scope Out: 9:52:10 AM Scope Withdrawal Time: 0 hours 9 minutes 27 seconds  Total Procedure Duration: 0 hours 13 minutes 12 seconds  Findings:                 The perianal and digital rectal examinations were                            normal.                           The colon (entire examined portion) appeared                            normal. Biopsies for histology were taken with a                            cold forceps from the entire colon for evaluation                            of microscopic colitis.                           The terminal ileum appeared normal. Biopsies were                            taken with a cold forceps for histology.  The exam was otherwise without abnormality on                            direct and retroflexion views. Complications:            No immediate complications. Estimated Blood Loss:     Estimated blood loss: none. Impression:               - The entire examined colon is normal. Biopsied.                           - The examined portion of the ileum was normal.                            Biopsied.                           - The examination was otherwise normal on direct                            and retroflexion views. Recommendation:           - Patient has a contact number available for                            emergencies. The signs and symptoms of potential                            delayed complications were discussed with the                            patient. Return to normal activities tomorrow.                            Written discharge instructions  were provided to the                            patient.                           - Resume previous diet.                           - Continue present medications.                           - Await pathology results.                           - Repeat colonoscopy at age 51 for screening                            purposes.                           - The findings and recommendations were discussed  with the patient's family. Lynann Bologna, MD 03/09/2023 9:56:47 AM This report has been signed electronically.

## 2023-03-09 NOTE — Progress Notes (Signed)
 To pacu, VSS. Report to [email or web address]

## 2023-03-10 ENCOUNTER — Telehealth: Payer: Self-pay | Admitting: *Deleted

## 2023-03-10 NOTE — Telephone Encounter (Signed)
  Follow up Call-     03/09/2023    8:59 AM  Call back number  Post procedure Call Back phone  # [phone]  Permission to leave phone message Yes     Patient questions:  Do you have a fever, pain , or abdominal swelling? No. Pain Score  0 *  Have you tolerated food without any problems? Yes.    Have you been able to return to your normal activities? Yes.    Do you have any questions about your discharge instructions: Diet   No. Medications  No. Follow up visit  No.  Do you have questions or concerns about your Care? No.  Actions: * If pain score is 4 or above: No action needed, pain <4.

## 2023-03-13 LAB — SURGICAL PATHOLOGY

## 2023-03-14 ENCOUNTER — Encounter: Payer: Self-pay | Admitting: Gastroenterology

## 2023-03-14 DIAGNOSIS — R7989 Other specified abnormal findings of blood chemistry: Secondary | ICD-10-CM | POA: Diagnosis not present

## 2023-03-14 DIAGNOSIS — N926 Irregular menstruation, unspecified: Secondary | ICD-10-CM | POA: Diagnosis not present

## 2023-03-14 DIAGNOSIS — D352 Benign neoplasm of pituitary gland: Secondary | ICD-10-CM | POA: Diagnosis not present

## 2023-03-21 ENCOUNTER — Encounter: Payer: Self-pay | Admitting: Gastroenterology

## 2023-03-21 ENCOUNTER — Encounter: Payer: BC Managed Care – PPO | Admitting: Internal Medicine

## 2023-03-29 DIAGNOSIS — Z111 Encounter for screening for respiratory tuberculosis: Secondary | ICD-10-CM | POA: Diagnosis not present

## 2023-03-31 DIAGNOSIS — Z0279 Encounter for issue of other medical certificate: Secondary | ICD-10-CM | POA: Diagnosis not present

## 2023-03-31 DIAGNOSIS — Z111 Encounter for screening for respiratory tuberculosis: Secondary | ICD-10-CM | POA: Diagnosis not present

## 2023-04-03 ENCOUNTER — Ambulatory Visit: Payer: BC Managed Care – PPO | Admitting: Primary Care

## 2023-04-10 ENCOUNTER — Encounter: Payer: Self-pay | Admitting: Cardiology

## 2023-04-10 ENCOUNTER — Ambulatory Visit: Payer: BC Managed Care – PPO | Attending: Cardiology | Admitting: Cardiology

## 2023-04-10 VITALS — BP 110/78 | HR 96 | Ht 62.0 in | Wt 163.2 lb

## 2023-04-10 DIAGNOSIS — R55 Syncope and collapse: Secondary | ICD-10-CM

## 2023-04-10 DIAGNOSIS — Z8679 Personal history of other diseases of the circulatory system: Secondary | ICD-10-CM

## 2023-04-10 DIAGNOSIS — R002 Palpitations: Secondary | ICD-10-CM

## 2023-04-10 DIAGNOSIS — M940 Chondrocostal junction syndrome [Tietze]: Secondary | ICD-10-CM

## 2023-04-10 DIAGNOSIS — G90A Postural orthostatic tachycardia syndrome (POTS): Secondary | ICD-10-CM

## 2023-04-10 NOTE — Patient Instructions (Addendum)
 Medication Instructions:  No changes      Lab Work: Not needed .   Testing/Procedures: Not needed   Follow-Up: At [facility], you and your health needs are our priority.  As part of our continuing mission to provide you with exceptional heart care, we have created designated Provider Care Teams.  These Care Teams include your primary Cardiologist (physician) and Advanced Practice Providers (APPs -  Physician Assistants and Nurse Practitioners) who all work together to provide you with the care you need, when you need it.     Your next appointment:    6 to1 2 month(s)  as needed  The format for your next appointment:   Person   Provider:   Bryan Lemma, MD    Other Instructions   Postural Orthostatic Tachycardia Syndrome: Spent >20 minutes counseling specifically on the etiology, diagnosis, and symptom management of POTS. The following recommendations were emphasized:  -avoid dehydration. Often it requires high volumes of fluids, often with salt/electrolytes included, to stay hydrated. People with POTS are very sensitive to fluid shifts and dehydration. Oral rehydration is preferred, and routine use of IV fluids is not recommended.  -if tolerated, compression stocking can assist with fluid management and prevent pooling in the legs.  -slow position changes are recommended  -if there is a feeling of severe lightheadedness, like near to passing out, recommend lying on the floor on the back, with legs elevated up on a chair or up against the wall.  -the best long term management of POTS symptoms is gradual exercise conditioning. I recommend seated exercises such as bike to start, to avoid the risk of falling with lightheadedness. Exercise programs, either through supervised programs like cardiac rehab or through personal programs, should focus on gradually increasing exercise tolerance and conditioning.   -this is a link to specific exercise recommendations for POTS:    http://peterson-powell.net/  -we discussed the typical spectrum of dysautonomia, including typical populations, that this sometimes spontaneously improves with age (though a small percentage have persistent symptoms), that this has uncomfortable symptoms but is not associated with long term mortality, and that the etiology/treatment of this is an area of active research     s

## 2023-04-10 NOTE — Progress Notes (Unsigned)
 Cardiology Office Note:  .   Date:  04/12/2023  ID:  Maria [name], DOB [birth year], MRN [id number] PCP: Georgianne Fick, MD  [facility] Providers Cardiologist:  Bryan Lemma, MD     Chief Complaint  Patient presents with   Follow-up    Follow-up to discuss test results.  No further syncope    Patient Profile: .     Maria [name] is a very pleasant 23 y.o. female with a PMH notable for history of SVT ablation in 2019 as well as history of pituitary adenoma who presents here for follow-up evaluation for syncopal episode.  She returns at the request of Georgianne Fick, MD.  Her initial evaluation was an ER follow-up for syncope at the request of Legrand Como, [place]*.  She had previously been followed by [facility] SVT -initial ablation in December 2018; redo ablation October 2019 (RFA) - Initial evaluation September 2017 denied any history of frank syncope, but did have near syncope.  Mostly noted a squeezing chest tightness with her tachycardia episodes. Last visit for [facility] was 03/07/2018: Reassuring physical exam, electrocardiogram and echocardiogram.  Having chest pain episodes but not likely thought to be related to cardiac issues.  She is complaining of sharp stabbing chest pains.     Maria [name] was last seen on 02/07/2023 for evaluation of syncope => this episode was triggered after having an episode of BRBPR she began feeling lightheaded and nauseated while leaving the doctor's office and had a near syncopal episode.  Apparently she actually did not have LOC.  She denied any sensation of rapid irregular heartbeats or palpitations.=> She evaluated with a 2D Echo and Zio patch monitor  Subjective  Discussed the use of AI scribe software for clinical note transcription with the patient, who gave verbal consent to proceed.  Based on her having history of SVT we opted to order Zio patch monitor to ensure no evidence of SVT and  ordered a 2D echo to evaluate for structural normality.  History of Present Illness History of Present Illness Maria [name] is a 23 year old female with a history of SVT ablation who presents with chest pain and palpitations.  She experiences chest pain and palpitations primarily when lying down, especially on her side. The pain is located near the costosternal junction and is absent when she is upright and walking. There is no associated shortness of breath or dizziness when upright.  She has a history of SVT ablation performed four to five years ago and notes that her heart rate tends to run high. Palpitations occur more frequently around the start of her menstrual cycle, which is a new development for her. She wore a heart monitor for two weeks, during which she experienced symptoms, but developed a rash and itching, preventing completion of the second monitoring session. The monitor showed her heart rate reaching up to 163 bpm, with an average of 106 bpm, but no significant abnormalities were detected.  She denies any recent syncope or chest pressure. She has experienced dizziness and wooziness upon standing, and a previous doctor suggested a POTS test. She has been managing these symptoms with increased hydration and salt intake.  There have been no recent episodes of hematochezia, and suppositories have been effective in the past.     Objective    Studies Reviewed: Marland Kitchen        ECHO 02/27/2023: EF 55 to 60%.  Normal wall motion.  Normal diastolic parameters.  Normal RV.  Normal  valves.  Normal RAP. She wore a monitor for 14 days and then when she took the first 1 off and but the second 1 on she started having a rash.  She took that off after maybe 2 days but unfortunately sent the wrong 1 and so we have the results from the shorter spell which was relatively normal  Risk Assessment/Calculations:            Physical Exam:   VS:  BP 110/78   Pulse 96   Ht 5\' 2"  (1.575 m)   Wt 163  lb 3.2 oz (74 kg)   SpO2 99%   BMI 29.85 kg/m    Wt Readings from Last 3 Encounters:  04/10/23 163 lb 3.2 oz (74 kg)  03/09/23 150 lb (68 kg)  02/07/23 158 lb 3.2 oz (71.8 kg)    GEN: Well nourished, well developed in no acute distress; healthy appearing NECK: No JVD; No carotid bruits CARDIAC: Normal S1, S2; RRR, no murmurs, rubs, gallops; reproducible left upper chest pain along the 2nd and 3rd ribs at the costochondral junction. RESPIRATORY:  Clear to auscultation without rales, wheezing or rhonchi ; nonlabored, good air movement. ABDOMEN: Soft, non-tender, non-distended EXTREMITIES:  No edema; No deformity      ASSESSMENT AND PLAN: .    Problem List Items Addressed This Visit       Cardiology Problems   POTS (postural orthostatic tachycardia syndrome)   Some of her symptoms would suggest POTS versus inappropriate sinus tachycardia versus simply just vasovagal.  Tilt table test not performed due to limited diagnostic value. Management focuses on non-pharmacological interventions. - Advised on hydration and salt intake to maintain blood volume. - Recommended core strengthening and isometric exercises to improve venous return. - Provided educational resources on POTS management.  -we discussed the typical spectrum of dysautonomia, including typical populations, that this sometimes spontaneously improves with age (though a small percentage have persistent symptoms), that this has uncomfortable symptoms but is not associated with long term mortality, and that the etiology/treatment of this is an area of active research   -avoid dehydration. Often it requires high volumes of fluids, often with salt/electrolytes included, to stay hydrated. People with POTS are very sensitive to fluid shifts and dehydration. Oral rehydration is preferred, and routine use of IV fluids is not recommended.  -if tolerated, compression stocking can assist with fluid management and prevent pooling in the  legs.  -slow position changes are recommended  -if there is a feeling of severe lightheadedness, like near to passing out, recommend lying on the floor on the back, with legs elevated up on a chair or up against the wall.  -the best long term management of POTS symptoms is gradual exercise conditioning. I recommend seated exercises such as bike to start, to avoid the risk of falling with lightheadedness. Exercise programs, either through supervised programs like cardiac rehab or through personal programs, should focus on gradually increasing exercise tolerance and conditioning.          Other   Costochondritis   Intermittent chest pain likely due to musculoskeletal inflammation at the costosternal junction. No concerning symptoms present. - Recommend over-the-counter analgesics for pain relief if needed.      History of supraventricular tachycardia   History of SVT ablation.  We still need to see the actual 14-day patch that she did wear.  She will send that in and we can reassess.  Reiterated need for hydration and discussed vagal maneuvers  Palpitations (Chronic)   Palpitations likely related to hormonal changes, inappropriate sinus tachycardia, or POTS. Echocardiogram normal. No SVT recurrence post-ablation. - Review first heart monitor results. - Provided information on dysautonomia and POTS management, including hydration, salt intake, and core exercises. - Advised on lifestyle modifications to manage palpitations, such as avoiding caffeine and ensuring adequate sleep.      Syncope and collapse - Primary (Chronic)   Her single episode was probably vasovagal given the fact that was associated with her bowel movement and BRBPR.  Could also been associated with just not feeling well and being somewhat dehydrated.  There was question about her potentially having POTS with having baseline tachycardia.  Thankfully, no further episodes.        Follow-Up: Return in 6 months (on  10/10/2023), or if symptoms worsen or fail to improve, for  6 to 12 month s, [facility] [address] Followup when necessary. Monitor results pending. Follow-up plan depends on heart monitor results. Symptoms likely related to dysautonomia. - Review heart monitor results once received and contact her with findings. - Schedule follow-up appointment in six months if needed, or sooner if symptoms worsen.     Signed, Marykay Lex, MD, MS Bryan Lemma, M.D., M.S. Interventional Cardiologist  [facility]  Pager # [phone] Phone # [phone] [address] [place], [place] [address]

## 2023-04-11 DIAGNOSIS — N926 Irregular menstruation, unspecified: Secondary | ICD-10-CM | POA: Diagnosis not present

## 2023-04-12 ENCOUNTER — Encounter: Payer: Self-pay | Admitting: Cardiology

## 2023-04-12 DIAGNOSIS — G90A Postural orthostatic tachycardia syndrome (POTS): Secondary | ICD-10-CM | POA: Insufficient documentation

## 2023-04-12 DIAGNOSIS — M940 Chondrocostal junction syndrome [Tietze]: Secondary | ICD-10-CM | POA: Insufficient documentation

## 2023-04-12 NOTE — Assessment & Plan Note (Signed)
 Her single episode was probably vasovagal given the fact that was associated with her bowel movement and BRBPR.  Could also been associated with just not feeling well and being somewhat dehydrated.  There was question about her potentially having POTS with having baseline tachycardia.  Thankfully, no further episodes.

## 2023-04-12 NOTE — Assessment & Plan Note (Signed)
 History of SVT ablation.  We still need to see the actual 14-day patch that she did wear.  She will send that in and we can reassess.  Reiterated need for hydration and discussed vagal maneuvers

## 2023-04-12 NOTE — Assessment & Plan Note (Addendum)
 Some of her symptoms would suggest POTS versus inappropriate sinus tachycardia versus simply just vasovagal.  Tilt table test not performed due to limited diagnostic value. Management focuses on non-pharmacological interventions. - Advised on hydration and salt intake to maintain blood volume. - Recommended core strengthening and isometric exercises to improve venous return. - Provided educational resources on POTS management.  -we discussed the typical spectrum of dysautonomia, including typical populations, that this sometimes spontaneously improves with age (though a small percentage have persistent symptoms), that this has uncomfortable symptoms but is not associated with long term mortality, and that the etiology/treatment of this is an area of active research   -avoid dehydration. Often it requires high volumes of fluids, often with salt/electrolytes included, to stay hydrated. People with POTS are very sensitive to fluid shifts and dehydration. Oral rehydration is preferred, and routine use of IV fluids is not recommended.  -if tolerated, compression stocking can assist with fluid management and prevent pooling in the legs.  -slow position changes are recommended  -if there is a feeling of severe lightheadedness, like near to passing out, recommend lying on the floor on the back, with legs elevated up on a chair or up against the wall.  -the best long term management of POTS symptoms is gradual exercise conditioning. I recommend seated exercises such as bike to start, to avoid the risk of falling with lightheadedness. Exercise programs, either through supervised programs like cardiac rehab or through personal programs, should focus on gradually increasing exercise tolerance and conditioning.

## 2023-04-12 NOTE — Assessment & Plan Note (Signed)
 Intermittent chest pain likely due to musculoskeletal inflammation at the costosternal junction. No concerning symptoms present. - Recommend over-the-counter analgesics for pain relief if needed.

## 2023-04-12 NOTE — Assessment & Plan Note (Signed)
 Palpitations likely related to hormonal changes, inappropriate sinus tachycardia, or POTS. Echocardiogram normal. No SVT recurrence post-ablation. - Review first heart monitor results. - Provided information on dysautonomia and POTS management, including hydration, salt intake, and core exercises. - Advised on lifestyle modifications to manage palpitations, such as avoiding caffeine and ensuring adequate sleep.

## 2023-04-24 DIAGNOSIS — N939 Abnormal uterine and vaginal bleeding, unspecified: Secondary | ICD-10-CM | POA: Diagnosis not present

## 2023-05-03 ENCOUNTER — Telehealth: Payer: Self-pay

## 2023-05-03 NOTE — Telephone Encounter (Signed)
   Pre-operative Risk Assessment    Patient Name: Maria [name]  DOB: 2000-01-27 MRN: [id number]   Date of last office visit: 04/10/23 Randene Bustard, MD Date of next office visit: NONE   Request for Surgical Clearance    Procedure:   HYSTEROSCOPY D & C, POSSIBLE MYOSURE  Date of Surgery:  Clearance 05/17/23                                Surgeon:  Thora Flint, MD Surgeon's Group or Practice Name:  [facility] Phone number:  [phone] Fax number:  [phone] ATTN: SURGERY SCHEDULER   Type of Clearance Requested:   - Medical    Type of Anesthesia:   PROPOFOL    Additional requests/questions:    SignedCollin Deal   05/03/2023, 4:17 PM

## 2023-05-03 NOTE — Telephone Encounter (Signed)
 I saw her for an episode of syncope.  It was probably vasovagal after having a bowel movement.  No evaluation needs to be done for her to have her hysteroscopy procedure. Would recommend ensuring adequate hydration and potentially even bolus IV fluids preop (up to 1 or 2 L in the preop/perioperative timeframe)   Otherwise she had no arrhythmias or other concerning symptoms.   Randene Bustard, MD

## 2023-05-04 NOTE — Telephone Encounter (Signed)
   Patient Name: Maria [name]  DOB: 08/23/2000 MRN: [id number]  Primary Cardiologist: Randene Bustard, MD  Chart reviewed as part of pre-operative protocol coverage.  Patient was last seen in clinic on 04/10/2023 by Dr. Addie Holstein. Per Dr. Addie Holstein patient was seen for an episode of syncope that was felt to likely be vasovagal after having a bowel movement, otherwise she had no arrhythmias or concerning symptoms.  No further cardiac evaluation is needed prior to her procedure.  He does recommend ensuring adequate hydration and potentially even bolus IV fluids preop (up to 1 or 2L in the preop/perioperative timeframe).    I will route this recommendation to the requesting party via Epic fax function and remove from pre-op pool.  Please call with questions.  Burnis Halling D Darleen Moffitt, NP 05/04/2023, 8:47 AM

## 2023-05-09 DIAGNOSIS — D352 Benign neoplasm of pituitary gland: Secondary | ICD-10-CM | POA: Diagnosis not present

## 2023-05-09 DIAGNOSIS — R42 Dizziness and giddiness: Secondary | ICD-10-CM | POA: Diagnosis not present

## 2023-05-09 DIAGNOSIS — R55 Syncope and collapse: Secondary | ICD-10-CM | POA: Diagnosis not present

## 2023-05-09 DIAGNOSIS — R200 Anesthesia of skin: Secondary | ICD-10-CM | POA: Diagnosis not present

## 2023-05-17 DIAGNOSIS — N939 Abnormal uterine and vaginal bleeding, unspecified: Secondary | ICD-10-CM | POA: Diagnosis not present

## 2023-05-17 DIAGNOSIS — N840 Polyp of corpus uteri: Secondary | ICD-10-CM | POA: Diagnosis not present

## 2023-05-22 ENCOUNTER — Ambulatory Visit: Admitting: Primary Care

## 2023-06-08 ENCOUNTER — Ambulatory Visit (INDEPENDENT_AMBULATORY_CARE_PROVIDER_SITE_OTHER): Admitting: Gastroenterology

## 2023-06-08 ENCOUNTER — Telehealth: Payer: Self-pay | Admitting: Gastroenterology

## 2023-06-08 ENCOUNTER — Encounter: Payer: Self-pay | Admitting: Gastroenterology

## 2023-06-08 VITALS — BP 110/70 | HR 98 | Ht 62.0 in | Wt 171.0 lb

## 2023-06-08 DIAGNOSIS — K602 Anal fissure, unspecified: Secondary | ICD-10-CM | POA: Diagnosis not present

## 2023-06-08 DIAGNOSIS — K6289 Other specified diseases of anus and rectum: Secondary | ICD-10-CM | POA: Diagnosis not present

## 2023-06-08 MED ORDER — DILTIAZEM GEL 2 %: CUTANEOUS | 1 refills | Status: AC

## 2023-06-08 NOTE — Telephone Encounter (Signed)
 Patient called and stated that she had completely forgot that she needed to ask to for a work note. Patient is now wondering if she can have a work not and sent over to her email. Please advise.

## 2023-06-08 NOTE — Patient Instructions (Addendum)
 We have sent a prescription for diltiazem 2 % gel to [facility]. You should apply a pea size amount to your rectum three times daily x 8 weeks.  [facility]'s information is below: Address: [address] [place], [place] [address]  [phone]  *Please DO NOT go directly from our office to pick up this medication! Give the pharmacy 1 day to process the prescription as this is compounded and takes time to make.  Warm tub soaks for 10-15 minutes 2-3 times daily. Make sure you dry area afterwards Avoid straining. Trial of glycerin suppositories as needed  Call office if symptoms are not improving within a few weeks

## 2023-06-08 NOTE — Telephone Encounter (Signed)
 Letter created & sent to patient's mychart, as well as a separate message notifying patient.

## 2023-06-08 NOTE — Progress Notes (Signed)
 Chief Complaint:   Referring Provider:  Virgle Grime, MD      ASSESSMENT AND PLAN;   #1. Rectal pain d/t post anal fissure. Neg colon 03/2023   Diltiazem  2% with lidocaine ointment -apply TID for 8 weeks (2RF) as instructed at clinic visit.  Warm tub soaks for 10-15 minutes 2-3 times daily. Make sure you dry area afterwards Avoid straining. Trial of glycerin suppositories as needed  Call office if symptoms are not improving within a few weeks FU in 24 weeks    HPI:    Maria [name] is a 23 y.o. female  Discussed the use of AI scribe software for clinical note transcription with the patient, who gave verbal consent to proceed.  History of Present Illness Maria [name] is a 23 year old female who presents with anal pain and discomfort during bowel movements.  She experiences significant anal pain during bowel movements, describing it as feeling like 'there's glass' when defecating. This pain has persisted for the past four to five days and is severe enough to cause reluctance to use the bathroom.  Her history of hemorrhoids is noted, with a decrease in bleeding but an increase in pain. The pain is sharp and localized to the anal area.  Bowel movements have been softer than usual, with no diarrhea or constipation. Occasional straining has occurred without exacerbating the pain.  A previous colonoscopy identified tiny hemorrhoids without other significant findings. Biopsies did not show Crohn's disease or other abnormalities. Blood work, including hemoglobin levels, was normal.  No current diarrhea or constipation. She reports softer stools and occasional straining without increased pain.    Past GI: Colon 03/09/2023 - The entire examined colon is normal. Biopsied. - The examined portion of the ileum was normal. Biopsied. - The examination was otherwise normal on direct and retroflexion views. - Rpt at age 44     Past Medical History:  Diagnosis Date    Anxiety    Bronchial spasms    Depression    Fatty liver    Headache    Pituitary adenoma (HCC)    SVT (supraventricular tachycardia) (HCC) 11/2015   Multiple fast episodes with heart rates as high as 245 bpm; ablation number 03 December 2016.  Ablation October 2019 [facility])   Tachycardia     Past Surgical History:  Procedure Laterality Date   ELBOW SURGERY Right    SVT ABLATION  12/2016   [facility]:  Inducible orthodromic SVT using a concealed bypass tract left posterior concealed bypass tract treated with RFA.  No inducible SVT following ablation and retrograde block at baseline following ablation.   SVT ABLATION  10/22/2017   [facility]:Redo ablation: Occasional inducible SVT at baseline using concealed bypass tract unsuccessful RFA ablation of left posterior septal accessory pathway..  Evidence of dual AV node physiology   TRANSTHORACIC ECHOCARDIOGRAM  03/2018   [facility]: Normal LV size and function.  Normal RV size and function.  Proximal coronary artery structures normal.  No effusion.  Structurally normal heart.  EF estimated> 70%    Family History  Problem Relation Age of Onset   Headache Father    Crohn's disease Sister    Depression Neg Hx    Anxiety disorder Neg Hx    Bipolar disorder Neg Hx    Schizophrenia Neg Hx    ADD / ADHD Neg Hx    Autism Neg Hx    Esophageal cancer Neg Hx    Stomach  cancer Neg Hx     Social History   Tobacco Use   Smoking status: Never   Smokeless tobacco: Former   Tobacco comments:    02/07/2023 Patient smoked vape for three to four months. Patient quit abut two months ago.  Vaping Use   Vaping status: Never Used  Substance Use Topics   Alcohol use: No    Comment: social   Drug use: No    Current Outpatient Medications  Medication Sig Dispense Refill   ondansetron  (ZOFRAN -ODT) 4 MG disintegrating tablet Take 4 mg by mouth as needed.     traZODone  (DESYREL ) 50 MG  tablet Take 1 tablet (50 mg total) by mouth at bedtime as needed for sleep. 30 tablet 3   FLUoxetine  (PROZAC ) 20 MG capsule Take 1 capsule (20 mg total) by mouth daily. (Patient not taking: Reported on 06/08/2023) 30 capsule 3   Vitamin D, Ergocalciferol, (DRISDOL) 1.25 MG (50000 UNIT) CAPS capsule Take 50,000 Units by mouth once a week. (Patient not taking: Reported on 06/08/2023)     No current facility-administered medications for this visit.    Allergies  Allergen Reactions   Banana Nausea And Vomiting    Review of Systems:  nrg    Physical Exam:    BP 110/70   Pulse 98   Ht 5' 2 (1.575 m)   Wt 171 lb (77.6 kg)   BMI 31.28 kg/m  Wt Readings from Last 3 Encounters:  06/08/23 171 lb (77.6 kg)  04/10/23 163 lb 3.2 oz (74 kg)  03/09/23 150 lb (68 kg)   Constitutional:  Well-developed, in no acute distress. Psychiatric: Normal mood and affect. Behavior is normal. HEENT: Pupils normal.  Conjunctivae are normal. No scleral icterus. Neck supple.  Cardiovascular: Normal rate, regular rhythm. No edema Pulmonary/chest: Effort normal and breath sounds normal. No wheezing, rales or rhonchi. Abdominal: Soft, nondistended. Nontender. Bowel sounds active throughout. There are no masses palpable. No hepatomegaly. Rectal: in presence of Brooke, small posterior anal fissure. Neurological: Alert and oriented to person place and time. Skin: Skin is warm and dry. No rashes noted.  Data Reviewed: I have personally reviewed following labs and imaging studies  CBC:    Latest Ref Rng & Units 01/03/2023   10:42 AM 12/25/2022   10:24 AM 12/09/2022    3:25 PM  CBC  WBC 4.0 - 10.5 K/uL 11.3  7.7  12.2   Hemoglobin 12.0 - 15.0 g/dL 16.1  09.6  04.5   Hematocrit 36.0 - 46.0 % 40.5  39.5  44.0   Platelets 150 - 400 K/uL 404  399  447.0     CMP:    Latest Ref Rng & Units 01/03/2023   11:16 AM 01/03/2023   10:42 AM 12/25/2022   10:24 AM  CMP  Glucose 70 - 99 mg/dL  99  409   BUN 6 - 20  mg/dL  9  12   Creatinine 8.11 - 1.00 mg/dL  9.14  7.82   Sodium 956 - 145 mmol/L  135  136   Potassium 3.5 - 5.1 mmol/L  3.3  3.6   Chloride 98 - 111 mmol/L  101  104   CO2 22 - 32 mmol/L  28  24   Calcium 8.9 - 10.3 mg/dL  9.1  9.2   Total Protein 6.5 - 8.1 g/dL 7.6   7.2   Total Bilirubin 0.0 - 1.2 mg/dL 0.4   0.5   Alkaline Phos 38 - 126 U/L 75  70   AST 15 - 41 U/L 20   18   ALT 0 - 44 U/L 12   11         Magnus Schuller, MD 06/08/2023, 11:30 AM  Cc: Virgle Grime, MD

## 2023-06-14 DIAGNOSIS — D352 Benign neoplasm of pituitary gland: Secondary | ICD-10-CM | POA: Diagnosis not present

## 2023-06-14 DIAGNOSIS — Z6831 Body mass index (BMI) 31.0-31.9, adult: Secondary | ICD-10-CM | POA: Diagnosis not present

## 2023-06-16 DIAGNOSIS — Z309 Encounter for contraceptive management, unspecified: Secondary | ICD-10-CM | POA: Diagnosis not present

## 2023-06-16 DIAGNOSIS — N920 Excessive and frequent menstruation with regular cycle: Secondary | ICD-10-CM | POA: Diagnosis not present

## 2023-06-22 DIAGNOSIS — R002 Palpitations: Secondary | ICD-10-CM

## 2023-06-22 DIAGNOSIS — R55 Syncope and collapse: Secondary | ICD-10-CM | POA: Diagnosis not present

## 2023-06-22 DIAGNOSIS — Z8679 Personal history of other diseases of the circulatory system: Secondary | ICD-10-CM | POA: Diagnosis not present

## 2023-08-28 ENCOUNTER — Other Ambulatory Visit: Payer: Self-pay | Admitting: Endocrinology

## 2023-08-28 DIAGNOSIS — D352 Benign neoplasm of pituitary gland: Secondary | ICD-10-CM
